# Patient Record
Sex: Male | Born: 1935 | Race: White | Hispanic: No | Marital: Married | Smoking: Never smoker
Health system: Southern US, Community
[De-identification: ages and names within clinical notes are randomized; demographics above are authoritative.]

## PROBLEM LIST (undated history)

## (undated) DIAGNOSIS — M436 Torticollis: Secondary | ICD-10-CM

## (undated) DIAGNOSIS — I10 Essential (primary) hypertension: Secondary | ICD-10-CM

## (undated) DIAGNOSIS — M1990 Unspecified osteoarthritis, unspecified site: Secondary | ICD-10-CM

## (undated) DIAGNOSIS — Z974 Presence of external hearing-aid: Secondary | ICD-10-CM

## (undated) HISTORY — PX: HERNIA REPAIR: SHX51

---

## 2010-09-22 DIAGNOSIS — I639 Cerebral infarction, unspecified: Secondary | ICD-10-CM

## 2010-09-22 HISTORY — DX: Cerebral infarction, unspecified: I63.9

## 2017-01-16 ENCOUNTER — Encounter (INDEPENDENT_AMBULATORY_CARE_PROVIDER_SITE_OTHER): Payer: Medicare Other

## 2017-01-16 ENCOUNTER — Ambulatory Visit (INDEPENDENT_AMBULATORY_CARE_PROVIDER_SITE_OTHER): Payer: Medicare Other | Admitting: Vascular Surgery

## 2017-04-26 ENCOUNTER — Emergency Department: Payer: Medicare Other

## 2017-04-26 ENCOUNTER — Emergency Department
Admission: EM | Admit: 2017-04-26 | Discharge: 2017-04-26 | Disposition: A | Discharge status: Home / Self Care | Payer: Medicare Other | Attending: Emergency Medicine | Admitting: Emergency Medicine

## 2017-04-26 DIAGNOSIS — I10 Essential (primary) hypertension: Secondary | ICD-10-CM | POA: Diagnosis not present

## 2017-04-26 DIAGNOSIS — Y929 Unspecified place or not applicable: Secondary | ICD-10-CM | POA: Insufficient documentation

## 2017-04-26 DIAGNOSIS — W010XXA Fall on same level from slipping, tripping and stumbling without subsequent striking against object, initial encounter: Secondary | ICD-10-CM | POA: Diagnosis not present

## 2017-04-26 DIAGNOSIS — W19XXXA Unspecified fall, initial encounter: Secondary | ICD-10-CM

## 2017-04-26 DIAGNOSIS — Z23 Encounter for immunization: Secondary | ICD-10-CM | POA: Insufficient documentation

## 2017-04-26 DIAGNOSIS — M25512 Pain in left shoulder: Secondary | ICD-10-CM | POA: Insufficient documentation

## 2017-04-26 DIAGNOSIS — S0003XA Contusion of scalp, initial encounter: Secondary | ICD-10-CM

## 2017-04-26 DIAGNOSIS — S0990XA Unspecified injury of head, initial encounter: Secondary | ICD-10-CM | POA: Diagnosis present

## 2017-04-26 DIAGNOSIS — M25511 Pain in right shoulder: Secondary | ICD-10-CM

## 2017-04-26 DIAGNOSIS — S8001XA Contusion of right knee, initial encounter: Secondary | ICD-10-CM

## 2017-04-26 DIAGNOSIS — Y999 Unspecified external cause status: Secondary | ICD-10-CM | POA: Insufficient documentation

## 2017-04-26 DIAGNOSIS — S8002XA Contusion of left knee, initial encounter: Secondary | ICD-10-CM | POA: Diagnosis not present

## 2017-04-26 DIAGNOSIS — Z7982 Long term (current) use of aspirin: Secondary | ICD-10-CM | POA: Diagnosis not present

## 2017-04-26 DIAGNOSIS — Y939 Activity, unspecified: Secondary | ICD-10-CM | POA: Insufficient documentation

## 2017-04-26 HISTORY — DX: Essential (primary) hypertension: I10

## 2017-04-26 MED ORDER — TETANUS-DIPHTH-ACELL PERTUSSIS 5-2.5-18.5 LF-MCG/0.5 IM SUSP
0.5000 mL | Freq: Once | INTRAMUSCULAR | Status: AC
  Administered 2017-04-26: 0.5 mL via INTRAMUSCULAR

## 2017-04-26 MED ORDER — PREDNISONE 10 MG PO TABS: 10.0000 mg | ORAL_TABLET | Freq: Every day | ORAL | 0 refills | Status: DC

## 2017-04-26 MED ORDER — TETANUS-DIPHTH-ACELL PERTUSSIS 5-2.5-18.5 LF-MCG/0.5 IM SUSP
INTRAMUSCULAR | Status: AC
  Filled 2017-04-26: qty 0.5
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
## 2017-04-26 NOTE — ED Triage Notes (Signed)
Patient c/o mechanical fall today. Pt reports right shoulder pain, left knee pain.  Patient c/o right knee pain to a lesser extent.  Pt c/o left shoulder pain to a lesser extent related to a fall 3 weeks ago.   Patient has abrasion to forehead, denies LOC, dizziness/lightheadedness, visual changes.

## 2017-04-26 NOTE — ED Notes (Signed)
Pt back from ct. Had xr's earlier. Pt alert, oriented. In nad. Family at bedside awaiting results.

## 2018-02-03 ENCOUNTER — Emergency Department: Payer: Medicare Other

## 2018-02-03 ENCOUNTER — Emergency Department
Admission: EM | Admit: 2018-02-03 | Discharge: 2018-02-03 | Disposition: A | Discharge status: Home / Self Care | Payer: Medicare Other | Attending: Emergency Medicine | Admitting: Emergency Medicine

## 2018-02-03 ENCOUNTER — Other Ambulatory Visit: Payer: Self-pay

## 2018-02-03 DIAGNOSIS — S51012A Laceration without foreign body of left elbow, initial encounter: Secondary | ICD-10-CM

## 2018-02-03 DIAGNOSIS — S51002A Unspecified open wound of left elbow, initial encounter: Secondary | ICD-10-CM | POA: Diagnosis not present

## 2018-02-03 DIAGNOSIS — W010XXA Fall on same level from slipping, tripping and stumbling without subsequent striking against object, initial encounter: Secondary | ICD-10-CM | POA: Insufficient documentation

## 2018-02-03 DIAGNOSIS — W19XXXA Unspecified fall, initial encounter: Secondary | ICD-10-CM

## 2018-02-03 DIAGNOSIS — S59902A Unspecified injury of left elbow, initial encounter: Secondary | ICD-10-CM | POA: Diagnosis present

## 2018-02-03 DIAGNOSIS — Y999 Unspecified external cause status: Secondary | ICD-10-CM | POA: Diagnosis not present

## 2018-02-03 DIAGNOSIS — Y939 Activity, unspecified: Secondary | ICD-10-CM | POA: Insufficient documentation

## 2018-02-03 DIAGNOSIS — Y9289 Other specified places as the place of occurrence of the external cause: Secondary | ICD-10-CM | POA: Diagnosis not present

## 2018-02-03 DIAGNOSIS — I10 Essential (primary) hypertension: Secondary | ICD-10-CM | POA: Diagnosis not present
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
## 2018-02-03 NOTE — ED Provider Notes (Signed)
[facility] Emergency Department Provider Note  ____________________________________________  Time seen: Approximately 3:50 PM  I have reviewed the triage vital signs and the nursing notes.   HISTORY  Chief Complaint Fall and Abrasion    HPI Jose Porter is a 84 y.o. male presents to the emergency department with a left elbow skin tear.  Patient reports that he was stepping into [place] when he lost his footing and fell on left elbow.  Patient reports that he did not hit his head or lose consciousness.  He has been ambulating without difficulty.  He denies new onset blurry vision, nausea, vomiting or disorientation.  Patient is accompanied by his wife who denies any changes in behavior.  Patient and his wife live independently.  No weakness, radiculopathy or changes in sensation in the upper or lower extremities.  Past Medical History:  Diagnosis Date  . Hypertension     There are no active problems to display for this patient.   Past Surgical History:  Procedure Laterality Date  . HERNIA REPAIR      Prior to Admission medications   Medication Sig Start Date End Date Taking? Authorizing Provider  predniSONE (DELTASONE) 10 MG tablet Take 1 tablet (10 mg total) by mouth daily. 04/26/17   Cuthriell, Delorise Royals, PA-C    Allergies Patient has no known allergies.  No family history on file.  Social History Social History   Tobacco Use  . Smoking status: Never Smoker  . Smokeless tobacco: Never Used  Substance Use Topics  . Alcohol use: No  . Drug use: No     Review of Systems  Constitutional: No fever/chills Eyes: No visual changes. No discharge ENT: No upper respiratory complaints. Cardiovascular: no chest pain. Respiratory: no cough. No SOB. Gastrointestinal: No abdominal pain.  No nausea, no vomiting.  No diarrhea.  No constipation. Musculoskeletal: Patient has left elbow discomfort.  Skin: Patient has skin tear.  Neurological:  Negative for headaches, focal weakness or numbness.  ____________________________________________   PHYSICAL EXAM:  VITAL SIGNS: ED Triage Vitals [02/03/18 1514]  Enc Vitals Group     BP (!) 143/90     Pulse Rate 81     Resp 16     Temp 97.8 F (36.6 C)     Temp Source Oral     SpO2 95 %     Weight 184 lb (83.5 kg)     Height  (1.753 m)     Head Circumference      Peak Flow      Pain Score 2     Pain Loc      Pain Edu?      Excl. in [place]?      Constitutional: Alert and oriented. Well appearing and in no acute distress. Eyes: Conjunctivae are normal. PERRL. EOMI. Head: Atraumatic. ENT:      Ears: TMs are pearly.      Nose: No congestion/rhinnorhea.      Mouth/Throat: Mucous membranes are moist.  Neck: No stridor. No cervical spine tenderness to palpation. Cardiovascular: Normal rate, regular rhythm. Normal S1 and S2.  Good peripheral circulation. Respiratory: Normal respiratory effort without tachypnea or retractions. Lungs CTAB. Good air entry to the bases with no decreased or absent breath sounds. Gastrointestinal: Bowel sounds 4 quadrants. Soft and nontender to palpation. No guarding or rigidity. No palpable masses. No distention. No CVA tenderness. Musculoskeletal: Full range of motion to all extremities. No gross deformities appreciated. Neurologic:  Normal speech and language. No  gross focal neurologic deficits are appreciated.  Skin:  Patient has a 2 cm skin tear.  Skin tear is avulsion type in nature.  Avulsion fragment is severely retracted.  No adipose tissue or muscle exposure.   Psychiatric: Mood and affect are normal. Speech and behavior are normal. Patient exhibits appropriate insight and judgement.   ____________________________________________   LABS (all labs ordered are listed, but only abnormal results are displayed)  Labs Reviewed - No data to  display ____________________________________________  EKG   ____________________________________________  RADIOLOGY Geraldo Pitter, personally viewed and evaluated these images (plain radiographs) as part of my medical decision making, as well as reviewing the written report by the radiologist.    Dg Elbow Complete Left  Result Date: 02/03/2018 CLINICAL DATA:  Acute LEFT elbow pain following fall. Initial encounter. EXAM: LEFT ELBOW - COMPLETE 3+ VIEW COMPARISON:  None. FINDINGS: There is no evidence of acute fracture, subluxation or dislocation. No joint effusion noted. Mild chronic bony changes along the LATERAL distal humerus noted. IMPRESSION: No evidence of acute abnormality. Electronically Signed   By: Harmon Pier M.D.   On: 02/03/2018 16:06    ____________________________________________    PROCEDURES  Procedure(s) performed:    Procedures  LACERATION REPAIR Performed by: Orvil Feil Authorized by: Orvil Feil Consent: Verbal consent obtained. Risks and benefits: risks, benefits and alternatives were discussed Consent given by: patient Patient identity confirmed: provided demographic data Prepped and Draped in normal sterile fashion Wound explored  Laceration Location: Left elbow  Laceration Length: 2 cm  No Foreign Bodies seen or palpated  Anesthesia: None   Irrigation method: syringe Amount of cleaning: standard  Skin closure: Dermabond   Patient tolerance: Patient tolerated the procedure well with no immediate complications.   Medications - No data to display   ____________________________________________   INITIAL IMPRESSION / ASSESSMENT AND PLAN / ED COURSE  Pertinent labs & imaging results that were available during my care of the patient were reviewed by me and considered in my medical decision making (see chart for details).  Review of the [place] CSRS was performed in accordance of the [facility] prior to dispensing any controlled  drugs.     Assessment and plan: Skin tear:  Patient presents to the emergency department with a 2 cm skin tear along the dorsal aspect of the left elbow repaired with Dermabond in the emergency department without complication.  Patient education regarding basic wound care was provided.  Patient was advised to follow-up with primary care as needed.  All patient questions were answered. ____________________________________________  FINAL CLINICAL IMPRESSION(S) / ED DIAGNOSES  Final diagnoses:  Fall, initial encounter  Skin tear of left elbow without complication, initial encounter      NEW MEDICATIONS STARTED DURING THIS VISIT:  ED Discharge Orders    None          This chart was dictated using voice recognition software/Dragon. Despite best efforts to proofread, errors can occur which can change the meaning. Any change was purely unintentional.    Orvil Feil, PA-C 02/03/18 1623    Don Perking, [place], MD 02/06/18 [phone]

## 2019-09-14 ENCOUNTER — Emergency Department
Admission: EM | Admit: 2019-09-14 | Discharge: 2019-09-14 | Disposition: A | Discharge status: Home / Self Care | Payer: Medicare Other | Attending: Emergency Medicine | Admitting: Emergency Medicine

## 2019-09-14 ENCOUNTER — Other Ambulatory Visit: Payer: Self-pay

## 2019-09-14 ENCOUNTER — Encounter: Payer: Self-pay | Admitting: Emergency Medicine

## 2019-09-14 DIAGNOSIS — L089 Local infection of the skin and subcutaneous tissue, unspecified: Secondary | ICD-10-CM | POA: Insufficient documentation

## 2019-09-14 DIAGNOSIS — S61512D Laceration without foreign body of left wrist, subsequent encounter: Secondary | ICD-10-CM | POA: Insufficient documentation

## 2019-09-14 DIAGNOSIS — T148XXA Other injury of unspecified body region, initial encounter: Secondary | ICD-10-CM

## 2019-09-14 DIAGNOSIS — I10 Essential (primary) hypertension: Secondary | ICD-10-CM | POA: Insufficient documentation

## 2019-09-14 DIAGNOSIS — Z5189 Encounter for other specified aftercare: Secondary | ICD-10-CM | POA: Insufficient documentation

## 2019-09-14 DIAGNOSIS — S61011D Laceration without foreign body of right thumb without damage to nail, subsequent encounter: Secondary | ICD-10-CM | POA: Diagnosis not present

## 2019-09-14 DIAGNOSIS — W19XXXD Unspecified fall, subsequent encounter: Secondary | ICD-10-CM | POA: Diagnosis not present

## 2019-09-14 MED ORDER — CEPHALEXIN 500 MG PO CAPS
500.0000 mg | ORAL_CAPSULE | Freq: Once | ORAL | Status: AC
Start: 2019-09-14 — End: 2019-09-14
  Administered 2019-09-14: 500 mg via ORAL
  Filled 2019-09-14: qty 1

## 2019-09-14 MED ORDER — CEPHALEXIN 500 MG PO CAPS: 500.0000 mg | ORAL_CAPSULE | Freq: Three times a day (TID) | ORAL | 0 refills | Status: AC

## 2019-09-14 NOTE — ED Triage Notes (Signed)
[facility] [facility] last week and went to urgent care and had left wrist wound taken care of--also stitches in right thumb.  But left wrist is now red and puffy around the wound.

## 2019-09-14 NOTE — ED Notes (Signed)
Pt ambulatory to room with personal cane. Pt reports hitting L wrist when he fell about a week ago. Pt has stitches to L wrist/hand and to R hand. Injury/stitches at R hand C/D/I. Injury/stitches a L wrist hot/red/swollen and wrist stiff to pt. Pt denies fever/chills/sweats. No bleeding noted. Radial pulses 2+ bilaterally.

## 2019-09-14 NOTE — ED Notes (Signed)
Pt's family given update with verbal okay from pt.

## 2019-09-14 NOTE — Discharge Instructions (Signed)
Keep the wound clean, dry, and covered. Wash daily with mild soap & water. Take the antibiotic as directed. Follow-up with the Urgent Care or Dr. Kym Groom for suture removal in 5-7 days.

## 2019-09-14 NOTE — ED Provider Notes (Signed)
[facility] Emergency Department Provider Note ____________________________________________  Time seen: 1656  I have reviewed the triage vital signs and the nursing notes.  HISTORY  Chief Complaint  Wound Check  HPI Jose Porter is a 85 y.o. male presents himself to the ED for wound check.  Patient describes a mechanical fall almost a week earlier where he sustained a laceration to his right thumb and left dorsal wrist.  He was evaluated and treated at a local urgent care, with suture repair see his wound.  X-ray did not reveal any underlying fractures.   His tetanus was updated at that visit.  Patient presents now after concern for some local erythema to the 2 wounds.  He denies any interim fevers, chills, sweats.  He also denies any wound dehiscence or purulent drainage.  He admits to keeping the wounds uncovered, and has not done any specific dedicated wound care to the areas.  Past Medical History:  Diagnosis Date  . Hypertension     There are no problems to display for this patient.   Past Surgical History:  Procedure Laterality Date  . HERNIA REPAIR      Prior to Admission medications   Medication Sig Start Date End Date Taking? Authorizing Provider  cephALEXin (KEFLEX) 500 MG capsule Take 1 capsule (500 mg total) by mouth 3 (three) times daily for 7 days. 09/14/19 09/21/19  Mallori Araque, Dannielle Karvonen, PA-C    Allergies Patient has no known allergies.  No family history on file.  Social History Social History   Tobacco Use  . Smoking status: Never Smoker  . Smokeless tobacco: Never Used  Substance Use Topics  . Alcohol use: No  . Drug use: No    Review of Systems  Constitutional: Negative for fever. Cardiovascular: Negative for chest pain. Respiratory: Negative for shortness of breath. Gastrointestinal: Negative for abdominal pain, vomiting and diarrhea. Genitourinary: Negative for dysuria. Musculoskeletal: Negative for back  pain. Skin: Negative for rash.  Wound infection as above. Neurological: Negative for headaches, focal weakness or numbness. ____________________________________________  PHYSICAL EXAM:  VITAL SIGNS: ED Triage Vitals  Enc Vitals Group     BP 09/14/19 1559 (!) 156/71     Pulse Rate 09/14/19 1559 77     Resp 09/14/19 1559 14     Temp 09/14/19 1559 97.6 F (36.4 C)     Temp Source 09/14/19 1559 Oral     SpO2 09/14/19 1559 98 %     Weight 09/14/19 1556 175 lb (79.4 kg)     Height 09/14/19 1556 5\' 9"  (1.753 m)     Head Circumference --      Peak Flow --      Pain Score 09/14/19 1555 3     Pain Loc --      Pain Edu? --      Excl. in [place]? --     Constitutional: Alert and oriented. Well appearing and in no distress. Head: Normocephalic and atraumatic. Eyes: Conjunctivae are normal. Normal extraocular movements Cardiovascular: Normal rate, regular rhythm. Normal distal pulses. Respiratory: Normal respiratory effort.  Musculoskeletal: normal composite fists bilaterally. Nontender with normal range of motion in all extremities.  Neurologic:  Normal gait without ataxia. Normal speech and language. No gross focal neurologic deficits are appreciated. Skin:  Skin is warm, dry and intact. No rash noted. Sutured wound to the left radial wrist with central scab and local erythema. No induration, dehisence, or purulent drainage noted. Sutured wound to the base of the right  thumb with some mild local (reactive) erythema.  Psychiatric: Mood and affect are normal. Patient exhibits appropriate insight and judgment. ____________________________________________  PROCEDURES  Keflex 500 mg PO Wound care Dressing applied Procedures ____________________________________________  INITIAL IMPRESSION / ASSESSMENT AND PLAN / ED COURSE  Patient with ED evaluation for concern of a possible wound infection.  Patient had a large abrasion and laceration to the radial aspect of the left wrist as well as an  abrasion/laceration to the right thumb repaired about a week earlier.  Patient has not kept the wound covered is not performed any local wound care.  The left wrist reveals some local erythema without wound dehiscence.  Patient's wound will be cleansed and dressed at this time.  Be treated empirically with Keflex and advised to follow with primary provider in 1 week for suture removal.  Return precautions have been reviewed.  [name] was evaluated in Emergency Department on 09/14/2019 for the symptoms described in the history of present illness. He was evaluated in the context of the global COVID-19 pandemic, which necessitated consideration that the patient might be at risk for infection with the SARS-CoV-2 virus that causes COVID-19. Institutional protocols and algorithms that pertain to the evaluation of patients at risk for COVID-19 are in a state of rapid change based on information released by regulatory bodies including the CDC and federal and state organizations. These policies and algorithms were followed during the patient's care in the ED. ____________________________________________  FINAL CLINICAL IMPRESSION(S) / ED DIAGNOSES  Final diagnoses:  Visit for wound check  Wound infection      Lissa Hoard, PA-C 09/14/19 1802    Chesley Noon, MD 09/16/19 [phone]

## 2019-09-14 NOTE — ED Notes (Signed)
Wound cleansed and dressing applied per PA's order.

## 2021-10-09 ENCOUNTER — Emergency Department: Payer: Medicare Other

## 2021-10-09 ENCOUNTER — Encounter: Payer: Self-pay | Admitting: Emergency Medicine

## 2021-10-09 ENCOUNTER — Other Ambulatory Visit: Payer: Self-pay

## 2021-10-09 ENCOUNTER — Emergency Department
Admission: EM | Admit: 2021-10-09 | Discharge: 2021-10-09 | Disposition: A | Discharge status: Home / Self Care | Payer: Medicare Other | Attending: Emergency Medicine | Admitting: Emergency Medicine

## 2021-10-09 DIAGNOSIS — Y9289 Other specified places as the place of occurrence of the external cause: Secondary | ICD-10-CM | POA: Insufficient documentation

## 2021-10-09 DIAGNOSIS — S0001XA Abrasion of scalp, initial encounter: Secondary | ICD-10-CM | POA: Insufficient documentation

## 2021-10-09 DIAGNOSIS — I10 Essential (primary) hypertension: Secondary | ICD-10-CM | POA: Diagnosis not present

## 2021-10-09 DIAGNOSIS — S62642A Nondisplaced fracture of proximal phalanx of right middle finger, initial encounter for closed fracture: Secondary | ICD-10-CM | POA: Diagnosis not present

## 2021-10-09 DIAGNOSIS — R519 Headache, unspecified: Secondary | ICD-10-CM | POA: Diagnosis not present

## 2021-10-09 DIAGNOSIS — S6991XA Unspecified injury of right wrist, hand and finger(s), initial encounter: Secondary | ICD-10-CM | POA: Diagnosis present

## 2021-10-09 DIAGNOSIS — S62643A Nondisplaced fracture of proximal phalanx of left middle finger, initial encounter for closed fracture: Secondary | ICD-10-CM

## 2021-10-09 DIAGNOSIS — W01198A Fall on same level from slipping, tripping and stumbling with subsequent striking against other object, initial encounter: Secondary | ICD-10-CM | POA: Diagnosis not present

## 2021-10-09 DIAGNOSIS — Y9389 Activity, other specified: Secondary | ICD-10-CM | POA: Diagnosis not present

## 2021-10-09 DIAGNOSIS — M542 Cervicalgia: Secondary | ICD-10-CM | POA: Diagnosis not present

## 2021-10-09 DIAGNOSIS — W19XXXA Unspecified fall, initial encounter: Secondary | ICD-10-CM

## 2021-10-09 NOTE — ED Triage Notes (Signed)
Pt to ED via [facility], with c/o fall at [place] from trying to get a run away cart, tripped and fell and hit his head. No LOC, He is having some neck pain and right hand pain. He does have a small abrasion on the top of his head, he does take a baby ASA every day.

## 2021-10-09 NOTE — ED Provider Notes (Signed)
[facility] Provider Note    Event Date/Time   First MD Initiated Contact with Patient 10/09/21 2006     (approximate)   History   Chief Complaint Fall   HPI  Jose Porter is a 88 y.o. male, history of hypertension, presents emergency department for evaluation of injury sustained from fall.  Patient states that he was trying to chase a grocery cart that was rolling away in the [employer] parking lot when he tripped and fell and hit his head.  Patient states that he also try to catch himself with his right hand.  He is currently endorsing mild headache, neck pain, and mild right hand pain.  Denies blood thinners, but does take baby aspirin every day.  Patient states that he has poor recollection of the event, but denies loss of consciousness.  Denies any symptoms preceding the event or immediate symptoms after the fall.  History Limitations: No limitations.      Physical Exam  Triage Vital Signs: ED Triage Vitals  Enc Vitals Group     BP 10/09/21 1920 (!) 160/92     Pulse Rate 10/09/21 1920 79     Resp 10/09/21 1920 18     Temp 10/09/21 1920 (!) 97.5 F (36.4 C)     Temp Source 10/09/21 1920 Oral     SpO2 10/09/21 1920 94 %     Weight 10/09/21 1921 190 lb (86.2 kg)     Height 10/09/21 1921 5\' 10"  (1.778 m)     Head Circumference --      Peak Flow --      Pain Score 10/09/21 1920 2     Pain Loc --      Pain Edu? --      Excl. in GC? --     Most recent vital signs: Vitals:   10/09/21 1920 10/09/21 2254  BP: (!) 160/92 (!) 159/87  Pulse: 79 82  Resp: 18 16  Temp: (!) 97.5 F (36.4 C)   SpO2: 94% 96%     Physical Exam Constitutional:      General: He is not in acute distress.    Appearance: Normal appearance. He is not ill-appearing.  HENT:     Head: Normocephalic.     Comments: 2 cm abrasion appreciated on the top of the scalp.  No active bleeding or discharge. Eyes:     Extraocular Movements: Extraocular movements intact.      Pupils: Pupils are equal, round, and reactive to light.  Pulmonary:     Effort: Pulmonary effort is normal.  Abdominal:     General: Abdomen is flat.     Palpations: Abdomen is soft.     Tenderness: There is no abdominal tenderness.  Musculoskeletal:     Cervical back: Normal range of motion.     Comments: No gross deformities to the patient's right hand, though notably arthritic.  Range of motion intact.  5/5 strength.  Pulse and sensation intact.  Patient does not have any bony tenderness and phalanges, metacarpal joints, or carpal bones.  Skin:    General: Skin is warm and dry.     Capillary Refill: Capillary refill takes less than 2 seconds.  Neurological:     Mental Status: He is alert and oriented to person, place, and time. Mental status is at baseline.     Cranial Nerves: No cranial nerve deficit.     Sensory: No sensory deficit.     Motor: No weakness.  Psychiatric:  Mood and Affect: Mood normal.        Behavior: Behavior normal.        Thought Content: Thought content normal.        Judgment: Judgment normal.      ED Results / Procedures / Treatments  Labs (all labs ordered are listed, but only abnormal results are displayed) Labs Reviewed - No data to display   EKG Not applicable.    RADIOLOGY I personally viewed and evaluated these images as part of my medical decision making, as well as reviewing the written report by the radiologist.  ED Provider Interpretation: No acute intracranial abnormalities.  Small intra-articular fracture at the base of the third phalange.  Volar subluxation of the fifth metacarpophalangeal joint.  I agree with the interpretations of the radiologist  CT Head Wo Contrast  Result Date: 10/09/2021 CLINICAL DATA:  Initial evaluation for acute trauma. EXAM: CT HEAD WITHOUT CONTRAST CT CERVICAL SPINE WITHOUT CONTRAST TECHNIQUE: Multidetector CT imaging of the head and cervical spine was performed following the standard protocol  without intravenous contrast. Multiplanar CT image reconstructions of the cervical spine were also generated. RADIATION DOSE REDUCTION: This exam was performed according to the departmental dose-optimization program which includes automated exposure control, adjustment of the mA and/or kV according to patient size and/or use of iterative reconstruction technique. COMPARISON:  None. FINDINGS: CT HEAD FINDINGS Brain: Age-related cerebral atrophy with chronic small vessel ischemic disease. Remote lacunar infarcts about the thalami. No acute intracranial hemorrhage. No acute large vessel territory infarct. No mass lesion or midline shift. No hydrocephalus or extra-axial fluid collection. Vascular: No hyperdense vessel. Calcified atherosclerosis present at skull base. Skull: Small soft tissue contusion/laceration at the scalp vertex. Calvarium intact. Lipoma noted at the left temporal region. Sinuses/Orbits: Visualized globes and orbital soft tissues within normal limits. Chronic right frontoethmoidal sinusitis. Mastoid air cells are clear. Other: None. CT CERVICAL SPINE FINDINGS Alignment: Straightening with reversal of the normal cervical lordosis. Trace anterolisthesis of C2 on C3, likely chronic and facet mediated. Skull base and vertebrae: Visualized skull base intact. Normal C1-2 articulations are preserved. Irregular lucencies with sclerotic margins extending through the right C2-3 facet noted, favored to be chronic and degenerative. Vertebral body height maintained. No acute fracture. No discrete or worrisome osseous lesions. Soft tissues and spinal canal: Paraspinous soft tissues demonstrate no acute finding. No abnormal prevertebral edema. Vascular calcifications noted about the carotid bifurcations. Disc levels: Moderate degenerative spondylosis at C3-4 through C6-7. Upper chest: Visualized upper chest demonstrates no acute finding. Other: None. IMPRESSION: CT BRAIN: 1. No acute intracranial abnormality. 2.  Small soft tissue contusion/laceration at the scalp vertex. No calvarial fracture. 3. Age-related cerebral atrophy with chronic small vessel ischemic disease. 4. Chronic right frontoethmoidal sinusitis. CT CERVICAL SPINE: 1. No acute traumatic injury within the cervical spine. 2. Moderate degenerative spondylosis at C3-4 through C6-7. Electronically Signed   By: Rise MuBenjamin  McClintock M.D.   On: 10/09/2021 21:47   CT Cervical Spine Wo Contrast  Result Date: 10/09/2021 CLINICAL DATA:  Initial evaluation for acute trauma. EXAM: CT HEAD WITHOUT CONTRAST CT CERVICAL SPINE WITHOUT CONTRAST TECHNIQUE: Multidetector CT imaging of the head and cervical spine was performed following the standard protocol without intravenous contrast. Multiplanar CT image reconstructions of the cervical spine were also generated. RADIATION DOSE REDUCTION: This exam was performed according to the departmental dose-optimization program which includes automated exposure control, adjustment of the mA and/or kV according to patient size and/or use of iterative reconstruction technique. COMPARISON:  None.  FINDINGS: CT HEAD FINDINGS Brain: Age-related cerebral atrophy with chronic small vessel ischemic disease. Remote lacunar infarcts about the thalami. No acute intracranial hemorrhage. No acute large vessel territory infarct. No mass lesion or midline shift. No hydrocephalus or extra-axial fluid collection. Vascular: No hyperdense vessel. Calcified atherosclerosis present at skull base. Skull: Small soft tissue contusion/laceration at the scalp vertex. Calvarium intact. Lipoma noted at the left temporal region. Sinuses/Orbits: Visualized globes and orbital soft tissues within normal limits. Chronic right frontoethmoidal sinusitis. Mastoid air cells are clear. Other: None. CT CERVICAL SPINE FINDINGS Alignment: Straightening with reversal of the normal cervical lordosis. Trace anterolisthesis of C2 on C3, likely chronic and facet mediated. Skull  base and vertebrae: Visualized skull base intact. Normal C1-2 articulations are preserved. Irregular lucencies with sclerotic margins extending through the right C2-3 facet noted, favored to be chronic and degenerative. Vertebral body height maintained. No acute fracture. No discrete or worrisome osseous lesions. Soft tissues and spinal canal: Paraspinous soft tissues demonstrate no acute finding. No abnormal prevertebral edema. Vascular calcifications noted about the carotid bifurcations. Disc levels: Moderate degenerative spondylosis at C3-4 through C6-7. Upper chest: Visualized upper chest demonstrates no acute finding. Other: None. IMPRESSION: CT BRAIN: 1. No acute intracranial abnormality. 2. Small soft tissue contusion/laceration at the scalp vertex. No calvarial fracture. 3. Age-related cerebral atrophy with chronic small vessel ischemic disease. 4. Chronic right frontoethmoidal sinusitis. CT CERVICAL SPINE: 1. No acute traumatic injury within the cervical spine. 2. Moderate degenerative spondylosis at C3-4 through C6-7. Electronically Signed   By: Rise Mu M.D.   On: 10/09/2021 21:47   DG Hand Complete Right  Result Date: 10/09/2021 CLINICAL DATA:  Status post reduction EXAM: RIGHT HAND - COMPLETE 3+ VIEW COMPARISON:  Film from earlier in the same day. FINDINGS: Fracture is again noted at the base of the third proximal phalanx stable in appearance from the prior exam. There remains interphalangeal degenerative changes as well as soft tissue swelling over the fifth MCP joint with mild volar subluxation of the proximal phalanx with respect to the metacarpal head. No other focal abnormality is seen. IMPRESSION: Stable fracture at the base of the third proximal phalanx. The remainder of the exam is unchanged. Electronically Signed   By: Alcide Clever M.D.   On: 10/09/2021 22:11   DG Hand Complete Right  Result Date: 10/09/2021 CLINICAL DATA:  Larey Seat, abrasions EXAM: RIGHT HAND - COMPLETE 3+ VIEW  COMPARISON:  None. FINDINGS: Frontal, oblique, and lateral views of the right hand are obtained. There is a small intra-articular fracture radial aspect base of the third proximal phalanx. Prominent volar subluxation of the fifth proximal phalanx relative to the fifth metacarpal head. Marked soft tissue swelling overlying the metacarpophalangeal joints. Diffuse osteoarthritis, greatest at the third distal interphalangeal joint and first interphalangeal joint. Likely subchondral cyst at the base of the second proximal phalanx. IMPRESSION: 1. Small intra-articular fracture at the base of the third proximal phalanx. 2. Volar subluxation of the fifth metacarpophalangeal joint. 3. Prominent soft tissue swelling overlying the metacarpophalangeal joints. 4. Multifocal osteoarthritis. Electronically Signed   By: Sharlet Salina M.D.   On: 10/09/2021 19:59    PROCEDURES:  Critical Care performed: None.  Procedures    MEDICATIONS ORDERED IN ED: Medications - No data to display   IMPRESSION / MDM / ASSESSMENT AND PLAN / ED COURSE  I reviewed the triage vital signs and the nursing notes.  Twana FirstKenneth Chiao is a 88 y.o. male, history of hypertension, presents emergency department for evaluation of injury sustained from fall.  Patient states that he was trying to chase a grocery cart that was rolling away in the [employer] parking lot when he tripped and fell and hit his head.  Patient states that he also try to catch himself with his right hand.  He is currently endorsing mild headache, neck pain, and mild right hand pain.   Differential diagnosis includes, but is not limited to, phalangeal dislocation/fracture, metacarpal dislocation/fracture, intracranial injury, concussion.  Patient appears well.  He is sitting upright comfortably in bed.  No apparent distress.  He is hypertensive at 160/92, but otherwise normal vitals.  Physical exam notable for abrasion on the top of the  scalp with no active bleeding.  Hands are notably arthritic, but no gross deformities present.  X-ray shows  Small intra-articular fracture at the base of the third phalange.  Volar subluxation of the fifth metacarpophalangeal joint.  Patient is not currently complaining of any pain with palpation or manipulation of his hand.  I went ahead and attempted reduction of the subluxated joint.  Patient tolerated the procedure well.  Will order postreduction x-ray.  Head CT negative for acute intracranial abnormalities.  Cervical spine CT unremarkable as well for acute pathology.  Post reduction x-ray continues to show mild subluxation of the fifth metacarpal phalangeal joint.  Attempted a second reduction, which was successful.  The patient's fracture on the third phalangeal was treated with buddy splinting to adjacent finger.  No further work-up or treatment indicated at this time in the emergency department.  We will plan to discharge this patient home. Patient was provided with anticipatory guidance, return precautions, and educational material. Encouraged the patient to return to the emergency department at any time if they begin to experience any new or worsening symptoms.       FINAL CLINICAL IMPRESSION(S) / ED DIAGNOSES   Final diagnoses:  Fall, initial encounter  Closed nondisplaced fracture of proximal phalanx of left middle finger, initial encounter     Rx / DC Orders   ED Discharge Orders     None        Note:  This document was prepared using Dragon voice recognition software and may include unintentional dictation errors.   Varney DailySimpson, Luma Clopper Lee, [place] 10/09/21 2359    Shaune PollackIsaacs, [place], MD 10/10/21 1943

## 2021-10-09 NOTE — Discharge Instructions (Signed)
-  Follow up with the orthopedist listed above -Please return to the ED at any time if you begin to experience any new or worsening symptoms -Continue to maintain splinting of the finger for at least 3 weeks, or until orthopedic follow up.

## 2022-03-24 ENCOUNTER — Inpatient Hospital Stay
Admission: EM | Admit: 2022-03-24 | Discharge: 2022-03-27 | DRG: 683 | Disposition: A | Discharge status: Home Health | Payer: Medicare Other | Source: Ambulatory Visit | Attending: Internal Medicine | Admitting: Internal Medicine

## 2022-03-24 ENCOUNTER — Other Ambulatory Visit: Payer: Self-pay

## 2022-03-24 ENCOUNTER — Emergency Department: Payer: Medicare Other

## 2022-03-24 DIAGNOSIS — N1832 Chronic kidney disease, stage 3b: Secondary | ICD-10-CM | POA: Diagnosis present

## 2022-03-24 DIAGNOSIS — E871 Hypo-osmolality and hyponatremia: Secondary | ICD-10-CM | POA: Diagnosis not present

## 2022-03-24 DIAGNOSIS — Z79899 Other long term (current) drug therapy: Secondary | ICD-10-CM

## 2022-03-24 DIAGNOSIS — Y92013 Bedroom of single-family (private) house as the place of occurrence of the external cause: Secondary | ICD-10-CM

## 2022-03-24 DIAGNOSIS — S060XAA Concussion with loss of consciousness status unknown, initial encounter: Secondary | ICD-10-CM | POA: Diagnosis present

## 2022-03-24 DIAGNOSIS — N1831 Chronic kidney disease, stage 3a: Secondary | ICD-10-CM | POA: Diagnosis not present

## 2022-03-24 DIAGNOSIS — N170 Acute kidney failure with tubular necrosis: Principal | ICD-10-CM

## 2022-03-24 DIAGNOSIS — W19XXXA Unspecified fall, initial encounter: Secondary | ICD-10-CM | POA: Diagnosis not present

## 2022-03-24 DIAGNOSIS — I129 Hypertensive chronic kidney disease with stage 1 through stage 4 chronic kidney disease, or unspecified chronic kidney disease: Secondary | ICD-10-CM | POA: Diagnosis present

## 2022-03-24 DIAGNOSIS — I10 Essential (primary) hypertension: Secondary | ICD-10-CM

## 2022-03-24 DIAGNOSIS — N179 Acute kidney failure, unspecified: Secondary | ICD-10-CM | POA: Diagnosis present

## 2022-03-24 DIAGNOSIS — E785 Hyperlipidemia, unspecified: Secondary | ICD-10-CM | POA: Diagnosis present

## 2022-03-24 DIAGNOSIS — N419 Inflammatory disease of prostate, unspecified: Secondary | ICD-10-CM | POA: Diagnosis present

## 2022-03-24 DIAGNOSIS — S060X0A Concussion without loss of consciousness, initial encounter: Secondary | ICD-10-CM | POA: Diagnosis not present

## 2022-03-24 DIAGNOSIS — B029 Zoster without complications: Secondary | ICD-10-CM | POA: Diagnosis present

## 2022-03-24 DIAGNOSIS — W06XXXA Fall from bed, initial encounter: Secondary | ICD-10-CM | POA: Diagnosis present

## 2022-03-24 DIAGNOSIS — E860 Dehydration: Secondary | ICD-10-CM | POA: Diagnosis present

## 2022-03-24 DIAGNOSIS — N400 Enlarged prostate without lower urinary tract symptoms: Secondary | ICD-10-CM | POA: Diagnosis present

## 2022-03-24 DIAGNOSIS — T368X5A Adverse effect of other systemic antibiotics, initial encounter: Secondary | ICD-10-CM | POA: Diagnosis present

## 2022-03-24 DIAGNOSIS — Y92009 Unspecified place in unspecified non-institutional (private) residence as the place of occurrence of the external cause: Secondary | ICD-10-CM | POA: Diagnosis not present

## 2022-03-24 LAB — URINALYSIS, ROUTINE W REFLEX MICROSCOPIC
Bilirubin Urine: NEGATIVE
Glucose, UA: NEGATIVE mg/dL
Hgb urine dipstick: NEGATIVE
Ketones, ur: NEGATIVE mg/dL
Leukocytes,Ua: NEGATIVE
Nitrite: NEGATIVE
Protein, ur: NEGATIVE mg/dL
Specific Gravity, Urine: 1.01 (ref 1.005–1.030)
pH: 5 (ref 5.0–8.0)

## 2022-03-24 LAB — BASIC METABOLIC PANEL
Anion gap: 9 (ref 5–15)
BUN: 39 mg/dL — ABNORMAL HIGH (ref 8–23)
CO2: 19 mmol/L — ABNORMAL LOW (ref 22–32)
Calcium: 8.9 mg/dL (ref 8.9–10.3)
Chloride: 103 mmol/L (ref 98–111)
Creatinine, Ser: 2.7 mg/dL — ABNORMAL HIGH (ref 0.61–1.24)
GFR, Estimated: 22 mL/min — ABNORMAL LOW (ref >60)
Glucose, Bld: 124 mg/dL — ABNORMAL HIGH (ref 70–99)
Potassium: 4.4 mmol/L (ref 3.5–5.1)
Sodium: 131 mmol/L — ABNORMAL LOW (ref 135–145)

## 2022-03-24 LAB — CBC
HCT: 35.8 % — ABNORMAL LOW (ref 39.0–52.0)
Hemoglobin: 12.1 g/dL — ABNORMAL LOW (ref 13.0–17.0)
MCH: 30.2 pg (ref 26.0–34.0)
MCHC: 33.8 g/dL (ref 30.0–36.0)
MCV: 89.3 fL (ref 80.0–100.0)
Platelets: 181 10*3/uL (ref 150–400)
RBC: 4.01 MIL/uL — ABNORMAL LOW (ref 4.22–5.81)
RDW: 14.3 % (ref 11.5–15.5)
WBC: 4.5 10*3/uL (ref 4.0–10.5)
nRBC: 0 % (ref 0.0–0.2)

## 2022-03-24 LAB — MAGNESIUM: Magnesium: 2.2 mg/dL (ref 1.7–2.4)

## 2022-03-24 LAB — TSH: TSH: 3.556 u[IU]/mL (ref 0.350–4.500)

## 2022-03-24 MED ORDER — SIMVASTATIN 20 MG PO TABS
20.0000 mg | ORAL_TABLET | Freq: Every day | ORAL | Status: DC
  Administered 2022-03-24 – 2022-03-26 (×3): 20 mg via ORAL
  Filled 2022-03-24 (×3): qty 1

## 2022-03-24 MED ORDER — FINASTERIDE 5 MG PO TABS
5.0000 mg | ORAL_TABLET | Freq: Every day | ORAL | Status: DC
  Administered 2022-03-24 – 2022-03-27 (×4): 5 mg via ORAL
  Filled 2022-03-24 (×5): qty 1

## 2022-03-24 MED ORDER — ONDANSETRON HCL 4 MG/2ML IJ SOLN: 4.0000 mg | Freq: Four times a day (QID) | INTRAMUSCULAR | Status: DC | PRN

## 2022-03-24 MED ORDER — VITAMIN B-12 1000 MCG PO TABS
1000.0000 ug | ORAL_TABLET | Freq: Every day | ORAL | Status: DC
  Administered 2022-03-24 – 2022-03-27 (×4): 1000 ug via ORAL
  Filled 2022-03-24 (×5): qty 1

## 2022-03-24 MED ORDER — OYSTER SHELL CALCIUM/D3 500-5 MG-MCG PO TABS
1.0000 | ORAL_TABLET | Freq: Two times a day (BID) | ORAL | Status: DC
  Administered 2022-03-24 – 2022-03-27 (×6): 1 via ORAL
  Filled 2022-03-24 (×6): qty 1

## 2022-03-24 MED ORDER — CARVEDILOL 3.125 MG PO TABS
3.1250 mg | ORAL_TABLET | Freq: Two times a day (BID) | ORAL | Status: DC
  Administered 2022-03-24 – 2022-03-27 (×6): 3.125 mg via ORAL
  Filled 2022-03-24 (×6): qty 1

## 2022-03-24 MED ORDER — ALBUTEROL SULFATE (2.5 MG/3ML) 0.083% IN NEBU: 2.5000 mg | INHALATION_SOLUTION | RESPIRATORY_TRACT | Status: DC | PRN

## 2022-03-24 MED ORDER — HEPARIN SODIUM (PORCINE) 5000 UNIT/ML IJ SOLN
5000.0000 [IU] | Freq: Three times a day (TID) | INTRAMUSCULAR | Status: DC
  Administered 2022-03-24 – 2022-03-27 (×8): 5000 [IU] via SUBCUTANEOUS
  Filled 2022-03-24 (×8): qty 1

## 2022-03-24 MED ORDER — ASPIRIN 81 MG PO TBEC
81.0000 mg | DELAYED_RELEASE_TABLET | Freq: Every day | ORAL | Status: DC
  Administered 2022-03-24 – 2022-03-27 (×4): 81 mg via ORAL
  Filled 2022-03-24 (×4): qty 1

## 2022-03-24 MED ORDER — SODIUM CHLORIDE 0.9 % IV SOLN: INTRAVENOUS | Status: AC

## 2022-03-24 MED ORDER — ADULT MULTIVITAMIN W/MINERALS CH
1.0000 | ORAL_TABLET | Freq: Every day | ORAL | Status: DC
  Administered 2022-03-24 – 2022-03-27 (×4): 1 via ORAL
  Filled 2022-03-24 (×4): qty 1

## 2022-03-24 MED ORDER — LACTATED RINGERS IV BOLUS
1000.0000 mL | Freq: Once | INTRAVENOUS | Status: AC
  Administered 2022-03-24: 1000 mL via INTRAVENOUS

## 2022-03-24 MED ORDER — ONDANSETRON HCL 4 MG PO TABS: 4.0000 mg | ORAL_TABLET | Freq: Four times a day (QID) | ORAL | Status: DC | PRN

## 2022-03-24 NOTE — ED Notes (Signed)
Pt presents to ED with c/o of a fall that happened on 6/22, pt states he did hit his head, pt states he only takes a baby ASA a day, no blood thinners noted, pt reports he had a lac tot he bridge of his nose, this is not observed at this time. Pt states he just wanted to make sure "my brain wasn't bleeding". Pt denies any episodes of confusion to this RN. Pt is currently A&Ox4 at this time. Pt denies LOC during his fall on 6/22. Pt does also endorse a headache that has been intermittent since his fall and also states this is what brought him to the ED. Pt does also endorse his headache does get better with OTC medications.   Family at bedside.

## 2022-03-24 NOTE — ED Provider Notes (Signed)
[facility] Provider Note    Event Date/Time   First MD Initiated Contact with Patient 03/24/22 1217     (approximate)   History   Chief Complaint Fall   HPI  Jose Porter is a 88 y.o. male with past medical history of hypertension who presents to the ED complaining of generalized weakness.  Patient reports that about 2 weeks ago he rolled out of bed and struck his head.  He reports feeling dizzy with the fall, but did not lose consciousness.  He states that he had significant bleeding from his nose and lip, but did not initially seek medical care.  Since then, he had been dealing with intermittent throbbing headaches over both sides of his head.  He states that despite this, he was eating and drinking normally and denies any associated abdominal pain, nausea, vomiting, or diarrhea.  He denies any fevers, cough, chest pain, shortness of breath, or dysuria.  He went to his PCPs office earlier today for evaluation and was referred to the ED for CT scan.     Physical Exam   Triage Vital Signs: ED Triage Vitals  Enc Vitals Group     BP 03/24/22 1127 118/66     Pulse Rate 03/24/22 1127 75     Resp 03/24/22 1127 16     Temp 03/24/22 1127 97.8 F (36.6 C)     Temp Source 03/24/22 1127 Oral     SpO2 03/24/22 1127 96 %     Weight --      Height --      Head Circumference --      Peak Flow --      Pain Score 03/24/22 1110 5     Pain Loc --      Pain Edu? --      Excl. in GC? --     Most recent vital signs: Vitals:   03/24/22 1127 03/24/22 1300  BP: 118/66 121/62  Pulse: 75 67  Resp: 16 20  Temp: 97.8 F (36.6 C)   SpO2: 96% 100%    Constitutional: Alert and oriented. Eyes: Conjunctivae are normal. Head: No facial bony tenderness or step-offs. Nose: No congestion/rhinnorhea. Mouth/Throat: Mucous membranes are moist.  Neck: No midline cervical spine tenderness to palpation. Cardiovascular: Normal rate, regular rhythm. Grossly normal heart  sounds.  2+ radial pulses bilaterally. Respiratory: Normal respiratory effort.  No retractions. Lungs CTAB. Gastrointestinal: Soft and nontender. No distention. Musculoskeletal: No lower extremity tenderness nor edema.  Neurologic:  Normal speech and language. No gross focal neurologic deficits are appreciated.    ED Results / Procedures / Treatments   Labs (all labs ordered are listed, but only abnormal results are displayed) Labs Reviewed  BASIC METABOLIC PANEL - Abnormal; Notable for the following components:      Result Value   Sodium 131 (*)    CO2 19 (*)    Glucose, Bld 124 (*)    BUN 39 (*)    Creatinine, Ser 2.70 (*)    GFR, Estimated 22 (*)    All other components within normal limits  CBC - Abnormal; Notable for the following components:   RBC 4.01 (*)    Hemoglobin 12.1 (*)    HCT 35.8 (*)    All other components within normal limits  URINALYSIS, ROUTINE W REFLEX MICROSCOPIC  MAGNESIUM  TSH  CBG MONITORING, ED     EKG  ED ECG REPORT I, Chesley Noon, the attending physician, personally viewed and interpreted  this ECG.   Date: 03/24/2022  EKG Time: 11:16  Rate: 79  Rhythm: normal sinus rhythm  Axis: Normal  Intervals:right bundle branch block  ST&T Change: None  RADIOLOGY CT head reviewed and interpreted by me with no hemorrhage or midline shift.  CT cervical spine reviewed and interpreted by me with no fracture or dislocation.  PROCEDURES:  Critical Care performed: No  Procedures   MEDICATIONS ORDERED IN ED: Medications  lactated ringers bolus 1,000 mL (1,000 mLs Intravenous Bolus 03/24/22 1336)     IMPRESSION / MDM / ASSESSMENT AND PLAN / ED COURSE  I reviewed the triage vital signs and the nursing notes.                              88 y.o. male with past medical history of hypertension who presents to the ED complaining of generalized weakness for the past 4 to 5 days after falling and hitting his head about 2 weeks ago.  Patient's  presentation is most consistent with acute presentation with potential threat to life or bodily function.  Differential diagnosis includes, but is not limited to, intracranial hemorrhage, cervical spine injury, dehydration, AKI, electrolyte abnormality, anemia, UTI, pneumonia.  Patient nontoxic-appearing and in no acute distress, vital signs are unremarkable and he has a nonfocal neurologic exam.  CT head and cervical spine are negative for acute traumatic injury however labs are concerning for AKI.  No significant associated electrolyte abnormality and no significant associated anemia.  AKI may be related to dehydration versus recent NSAID use or course of Bactrim.  We will hydrate with IV fluids, screen chest x-ray and urinalysis for contributing infection, and plan to discuss with hospitalist for admission.  Chest x-ray is unremarkable, on reassessment patient admits to taking 600 mg of ibuprofen as much as 3 times a day over the past 1-2 weeks.  It also appears he was on a 21-day course of Bactrim recently, both of which could have contributed to his AKI.  Case discussed with hospitalist for admission.      FINAL CLINICAL IMPRESSION(S) / ED DIAGNOSES   Final diagnoses:  AKI (acute kidney injury) (HCC)     Rx / DC Orders   ED Discharge Orders     None        Note:  This document was prepared using Dragon voice recognition software and may include unintentional dictation errors.   Chesley Noon, MD 03/24/22 1421

## 2022-03-24 NOTE — H&P (Signed)
History and Physical    Jose Porter XBL:[id number] DOB: 30-Jul-1929 DOA: 03/24/2022  PCP: Dione Housekeeper, MD  Patient coming from: pcp office  I have personally briefly reviewed patient's old medical records in [facility]  Chief Complaint: HA s/p fall over one week ago  HPI: Jose Porter is a 88 y.o. male with medical history significant of  HTN, CKDIII, HLD,BPH,who has interim history of prostatitis for which he was on bactrim x 3 weeks. Patient presents to ED in referral from PCP office for evaluation of HA since fall with head trauma on 03/13/22.  Patient notes no associated n/v/d/dysuria/ cough /fever chills/ dizziness/chest pain/ focal weakness , paresthesias or vision changes. He notes since his HA started he has taken Ibuprofen 3 times a day. He notes some relief but states HA recurs.  ED Course:  In ED evaluation notable for negative neuro imaging but labs notable for AKI thought to be due to nsaid use as well as bactrim use and dehydration. Vitals:  RAQ:TMAUQJ sinus , rbb,  q in inferior leads Labs Wbc 4.5, hgb 12.1 plt 181  NA 131, k 4.4, c r 2.7 was 1.5 mag 2.2 Tsh 3.5 UA neg  CTH: NAD Cxr NAD  Review of Systems: As per HPI otherwise 10 point review of systems negative.   Past Medical History:  Diagnosis Date   Hypertension     Past Surgical History:  Procedure Laterality Date   HERNIA REPAIR       reports that he has never smoked. He has never used smokeless tobacco. He reports that he does not drink alcohol and does not use drugs.  No Known Allergies  No family history on file. noncontributory Prior to Admission medications   Medication Sig Start Date End Date Taking? Authorizing Provider  Calcium Carb-Cholecalciferol 600-10 MG-MCG CAPS Take 1 capsule by mouth 2 (two) times daily. 08/25/08  Yes [provider]  carvedilol (COREG) 3.125 MG tablet Take 3.125 mg by mouth 2 (two) times daily. 03/07/22  Yes [provider]   finasteride (PROSCAR) 5 MG tablet Take 5 mg by mouth daily. 02/27/22  Yes [provider]  hydrochlorothiazide (HYDRODIURIL) 25 MG tablet Take 25 mg by mouth daily. 01/27/22  Yes [provider]  Multiple Vitamin (MULTIVITAMIN) capsule Take 1 capsule by mouth daily.   Yes [provider]  POTASSIUM PHOSPHATE PO Take 1 tablet by mouth daily.   Yes [provider]  simvastatin (ZOCOR) 20 MG tablet Take 20 mg by mouth at bedtime. 03/03/22  Yes [provider]  Study - CAPTIVA - aspirin 81 mg tablet (PI-Sethi) Chew 1 tablet by mouth daily. 08/25/08  Yes [provider]  sulfamethoxazole-trimethoprim (BACTRIM DS) 800-160 MG tablet Take 1 tablet by mouth 2 (two) times daily. 03/04/22  Yes [provider]  vitamin B-12 (CYANOCOBALAMIN) 1000 MCG tablet Take 1 tablet by mouth daily.   Yes [provider]  diclofenac Sodium (VOLTAREN) 1 % GEL Apply 2 g topically 2 (two) times daily. Patient not taking: Reported on 03/24/2022 10/21/21   [provider]    Physical Exam: Vitals:   03/24/22 1127 03/24/22 1300 03/24/22 1330  BP: 118/66 121/62 108/63  Pulse: 75 67 67  Resp: 16 20 (!) 23  Temp: 97.8 F (36.6 C)    TempSrc: Oral    SpO2: 96% 100% 100%     Vitals:   03/24/22 1127 03/24/22 1300 03/24/22 1330  BP: 118/66 121/62 108/63  Pulse: 75 67 67  Resp: 16 20 (!) 23  Temp: 97.8 F (36.6 C)    TempSrc: Oral    SpO2: 96% 100% 100%  Constitutional: NAD, calm, comfortable Eyes: PERRL, lids and conjunctivae normal ENMT: Mucous membranes are dry. Posterior pharynx clear of any exudate or lesions.Normal dentition.  Neck: normal, supple, no masses, no thyromegaly Respiratory: clear to auscultation bilaterally, no wheezing, no crackles. Normal respiratory effort. No accessory muscle use.  Cardiovascular: Regular rate and rhythm, no murmurs / rubs / gallops. No extremity edema. 2+ pedal pulses. Abdomen: no tenderness, no masses  palpated. No hepatosplenomegaly. Bowel sounds positive.  Musculoskeletal: no clubbing / cyanosis. No joint deformity upper and lower extremities. Good ROM, no contractures. Normal muscle tone.  Skin: no rashes, lesions, ulcers. No induration Neurologic: CN 2-12 grossly intact. Sensation intact, . Strength 5/5 in all 4.  Psychiatric: Normal judgment and insight. Alert and oriented x 3. Normal mood.    Labs on Admission: I have personally reviewed following labs and imaging studies  CBC: Recent Labs  Lab 03/24/22 1120  WBC 4.5  HGB 12.1*  HCT 35.8*  MCV 89.3  PLT 181   Basic Metabolic Panel: Recent Labs  Lab 03/24/22 1120  NA 131*  K 4.4  CL 103  CO2 19*  GLUCOSE 124*  BUN 39*  CREATININE 2.70*  CALCIUM 8.9  MG 2.2   GFR: CrCl cannot be calculated (Unknown ideal weight.). Liver Function Tests: No results for input(s): "AST", "ALT", "ALKPHOS", "BILITOT", "PROT", "ALBUMIN" in the last 168 hours. No results for input(s): "LIPASE", "AMYLASE" in the last 168 hours. No results for input(s): "AMMONIA" in the last 168 hours. Coagulation Profile: No results for input(s): "INR", "PROTIME" in the last 168 hours. Cardiac Enzymes: No results for input(s): "CKTOTAL", "CKMB", "CKMBINDEX", "TROPONINI" in the last 168 hours. BNP (last 3 results) No results for input(s): "PROBNP" in the last 8760 hours. HbA1C: No results for input(s): "HGBA1C" in the last 72 hours. CBG: No results for input(s): "GLUCAP" in the last 168 hours. Lipid Profile: No results for input(s): "CHOL", "HDL", "LDLCALC", "TRIG", "CHOLHDL", "LDLDIRECT" in the last 72 hours. Thyroid Function Tests: Recent Labs    03/24/22 1120  TSH 3.556   Anemia Panel: No results for input(s): "VITAMINB12", "FOLATE", "FERRITIN", "TIBC", "IRON", "RETICCTPCT" in the last 72 hours. Urine analysis:    Component Value Date/Time   COLORURINE YELLOW (A) 03/24/2022 1513   APPEARANCEUR CLEAR (A) 03/24/2022 1513   LABSPEC 1.010  03/24/2022 1513   PHURINE 5.0 03/24/2022 1513   GLUCOSEU NEGATIVE 03/24/2022 1513   HGBUR NEGATIVE 03/24/2022 1513   BILIRUBINUR NEGATIVE 03/24/2022 1513   KETONESUR NEGATIVE 03/24/2022 1513   PROTEINUR NEGATIVE 03/24/2022 1513   NITRITE NEGATIVE 03/24/2022 1513   LEUKOCYTESUR NEGATIVE 03/24/2022 1513    Radiological Exams on Admission: DG Chest 2 View  Result Date: 03/24/2022 CLINICAL DATA:  Weakness EXAM: CHEST - 2 VIEW COMPARISON:  None available FINDINGS: The cardiomediastinal silhouette is within normal limits. There is no focal airspace consolidation. There is no pleural effusion. No pneumothorax. There is no acute osseous abnormality. Bilateral shoulder degenerative changes. Thoracic spondylosis. IMPRESSION: No evidence of acute cardiopulmonary disease. Electronically Signed   By: Caprice Renshaw M.D.   On: 03/24/2022 13:30   CT HEAD WO CONTRAST ( )  Result Date: 03/24/2022 CLINICAL DATA:  Fall, possible concussion. EXAM: CT HEAD WITHOUT CONTRAST CT CERVICAL SPINE WITHOUT CONTRAST TECHNIQUE: Multidetector CT imaging of the head and cervical spine was performed following the standard protocol without intravenous contrast. Multiplanar  CT image reconstructions of the cervical spine were also generated. RADIATION DOSE REDUCTION: This exam was performed according to the departmental dose-optimization program which includes automated exposure control, adjustment of the mA and/or kV according to patient size and/or use of iterative reconstruction technique. COMPARISON:  CT examination dated October 09, 2021 FINDINGS: CT HEAD FINDINGS Brain: No evidence of acute infarction, hemorrhage, hydrocephalus, extra-axial collection or mass lesion/mass effect. Prominence of the ventricles and sulci secondary to mild cerebral volume loss. Low-attenuation of the periventricular and subcortical white matter presumed chronic microvascular ischemic changes. Lacunar infarct of the left thalamus, unchanged. Vascular: No  hyperdense vessel or unexpected calcification. Skull: Normal. Negative for fracture or focal lesion. Sinuses/Orbits: No acute finding. Mucosal thickening of the maxillary sinuses. Patchy opacification of the right ethmoid air cells. Other: None. CT CERVICAL SPINE FINDINGS Alignment: Reversal of normal cervical lordosis. Skull base and vertebrae: No acute fracture. No primary bone lesion or focal pathologic process. Soft tissues and spinal canal: No prevertebral fluid or swelling. No visible canal hematoma. Disc levels: C2-C3: No significant spinal canal or neural foraminal stenosis. Moderate bilateral facet joint arthropathy, right worse than the left. C3-C4: Disc height loss with mild to moderate spinal canal stenosis. Uncovertebral joint arthropathy and facet joint arthropathy with moderate bilateral neural foraminal stenosis. C4-C5: Disc height loss and disc osteophyte complex with spinal canal stenosis. Uncovertebral joint arthropathy with moderate bilateral neural foraminal stenosis. C5-C6: Disc height loss and uncovertebral joint arthropathy. No significant spinal canal stenosis. Mild left neural foraminal stenosis. C6-C7: Disc height loss and disc osteophyte complex. Mild spinal canal stenosis. No significant neural foraminal stenosis. C7-T1: Disc osteophyte complex. No significant spinal canal stenosis. Mild bilateral neural foraminal stenosis. Upper chest: Negative. Other: None IMPRESSION: CT head: 1.  No acute intracranial abnormality. 2. 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 3.  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 CT cervical spine: 1.  No acute fracture or traumatic subluxation. 2. Advanced degenerative changes with multilevel uncovertebral joint and facet joint arthropathy with associated neural foraminal stenosis as detailed above. Electronically Signed   By: Larose Hires D.O.   On:  03/24/2022 11:58   CT Cervical Spine Wo Contrast  Result Date: 03/24/2022 CLINICAL DATA:  Fall, possible concussion. EXAM: CT HEAD WITHOUT CONTRAST CT CERVICAL SPINE WITHOUT CONTRAST TECHNIQUE: Multidetector CT imaging of the head and cervical spine was performed following the standard protocol without intravenous contrast. Multiplanar CT image reconstructions of the cervical spine were also generated. RADIATION DOSE REDUCTION: This exam was performed according to the departmental dose-optimization program which includes automated exposure control, adjustment of the mA and/or kV according to patient size and/or use of iterative reconstruction technique. COMPARISON:  CT examination dated October 09, 2021 FINDINGS: CT HEAD FINDINGS Brain: No evidence of acute infarction, hemorrhage, hydrocephalus, extra-axial collection or mass lesion/mass effect. Prominence of the ventricles and sulci secondary to mild cerebral volume loss. Low-attenuation of the periventricular and subcortical white matter presumed chronic microvascular ischemic changes. Lacunar infarct of the left thalamus, unchanged. Vascular: No hyperdense vessel or unexpected calcification. Skull: Normal. Negative for fracture or focal lesion. Sinuses/Orbits: No acute finding. Mucosal thickening of the maxillary sinuses. Patchy opacification of the right ethmoid air cells. Other: None. CT CERVICAL SPINE FINDINGS Alignment: Reversal of normal cervical lordosis. Skull base and vertebrae: No acute fracture. No primary bone lesion or focal pathologic process. Soft tissues and spinal canal: No prevertebral fluid or swelling.  No visible canal hematoma. Disc levels: C2-C3: No significant spinal canal or neural foraminal stenosis. Moderate bilateral facet joint arthropathy, right worse than the left. C3-C4: Disc height loss with mild to moderate spinal canal stenosis. Uncovertebral joint arthropathy and facet joint arthropathy with moderate bilateral neural foraminal  stenosis. C4-C5: Disc height loss and disc osteophyte complex with spinal canal stenosis. Uncovertebral joint arthropathy with moderate bilateral neural foraminal stenosis. C5-C6: Disc height loss and uncovertebral joint arthropathy. No significant spinal canal stenosis. Mild left neural foraminal stenosis. C6-C7: Disc height loss and disc osteophyte complex. Mild spinal canal stenosis. No significant neural foraminal stenosis. C7-T1: Disc osteophyte complex. No significant spinal canal stenosis. Mild bilateral neural foraminal stenosis. Upper chest: Negative. Other: None IMPRESSION: CT head: 1.  No acute intracranial abnormality. 2. 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 3.  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 CT cervical spine: 1.  No acute fracture or traumatic subluxation. 2. Advanced degenerative changes with multilevel uncovertebral joint and facet joint arthropathy with associated neural foraminal stenosis as detailed above. Electronically Signed   By: Larose Hires D.O.   On: 03/24/2022 11:58    EKG: Independently reviewed.  Assessment/Plan  AKI on CKDIIIb -in setting of bactrim use for prostatitis, hctz, and Ibuprofen -hold nephrotoxic medications  - ivfs overnight strict I/o -urine eos -consider nephrology consult if renal function  not improved     Post concussive syndrome -patient with fall 03/13/22 - HA since fall -neuro-imaging negative  -neuro exam nonfocal  - supportive care with tylenol for now  -consider low dose neuropathic agent/ may need referral to neurology if HA are persistent   HTN -stable  -hold hctz due to AKI -continue carvedilol  HLD -continue statin   Prostatitis  -s/p 3 week course of bactrim  - last dose of bactrim this am    DVT prophylaxis: heparin  Code Status: full Family Communication: family at bedside [place], [place] (Daughter)   [phone] (Mobile) Disposition Plan: patient  expected to be admitted less than 2 midnights  Consults called: n/a Admission status: med tele   Lurline Del MD [facility]   If 7PM-7AM, please contact night-coverage [email or web address] Password [email or web address]  03/24/2022, 3:35 PM

## 2022-03-24 NOTE — ED Notes (Signed)
Informed RN bed assigned 

## 2022-03-24 NOTE — ED Triage Notes (Addendum)
Pt comes with c/o possible concussion and bleeding on brain. Pt states he fell out of bed end of June. Pt states laceration to nose. Pt states headache and dizziness. Pt takes aspirin daily.  Pt states the headache is little better since taking meds for it. Pt states some right shoulder pain as well.

## 2022-03-25 ENCOUNTER — Encounter: Payer: Self-pay | Admitting: Internal Medicine

## 2022-03-25 DIAGNOSIS — N1831 Chronic kidney disease, stage 3a: Secondary | ICD-10-CM

## 2022-03-25 DIAGNOSIS — S060XAA Concussion with loss of consciousness status unknown, initial encounter: Secondary | ICD-10-CM

## 2022-03-25 DIAGNOSIS — N170 Acute kidney failure with tubular necrosis: Secondary | ICD-10-CM | POA: Diagnosis not present

## 2022-03-25 DIAGNOSIS — W19XXXA Unspecified fall, initial encounter: Secondary | ICD-10-CM

## 2022-03-25 DIAGNOSIS — S060X0A Concussion without loss of consciousness, initial encounter: Secondary | ICD-10-CM | POA: Diagnosis not present

## 2022-03-25 DIAGNOSIS — I10 Essential (primary) hypertension: Secondary | ICD-10-CM

## 2022-03-25 LAB — BASIC METABOLIC PANEL
Anion gap: 7 (ref 5–15)
BUN: 34 mg/dL — ABNORMAL HIGH (ref 8–23)
CO2: 21 mmol/L — ABNORMAL LOW (ref 22–32)
Calcium: 9 mg/dL (ref 8.9–10.3)
Chloride: 105 mmol/L (ref 98–111)
Creatinine, Ser: 2.11 mg/dL — ABNORMAL HIGH (ref 0.61–1.24)
GFR, Estimated: 29 mL/min — ABNORMAL LOW (ref >60)
Glucose, Bld: 122 mg/dL — ABNORMAL HIGH (ref 70–99)
Potassium: 4.7 mmol/L (ref 3.5–5.1)
Sodium: 133 mmol/L — ABNORMAL LOW (ref 135–145)

## 2022-03-25 LAB — CBC
HCT: 34 % — ABNORMAL LOW (ref 39.0–52.0)
Hemoglobin: 11.6 g/dL — ABNORMAL LOW (ref 13.0–17.0)
MCH: 30 pg (ref 26.0–34.0)
MCHC: 34.1 g/dL (ref 30.0–36.0)
MCV: 87.9 fL (ref 80.0–100.0)
Platelets: 176 10*3/uL (ref 150–400)
RBC: 3.87 MIL/uL — ABNORMAL LOW (ref 4.22–5.81)
RDW: 14.3 % (ref 11.5–15.5)
WBC: 5 10*3/uL (ref 4.0–10.5)
nRBC: 0 % (ref 0.0–0.2)

## 2022-03-25 NOTE — Progress Notes (Signed)
  Progress Note   Patient: Jose Porter FBP:[id number] DOB: 01-24-1929 DOA: 03/24/2022     1 DOS: the patient was seen and examined on 03/25/2022   Brief hospital course: Jose Porter is a 88 y.o. male with medical history significant of  HTN, CKDIII, HLD,BPH,who has interim history of prostatitis for which he was on bactrim x 3 weeks.  Came to the hospital with a diagnosis of acute on chronic renal failure.  Baseline creatinine was 1.4.  Started on IV fluids  Assessment and Plan: Acute renal failure superimposed on chronic kidney disease stage IIIa. ATN. Patient baseline creatinine was 1.4 based on lab results in care everywhere.  Acute renal failure is caused by Bactrim, most likely ATN. Patient is placed on IV fluids, renal function started getting better. Patient lungs have crackles in the bases, but does not feel short of breath, no hypoxia.  Chest x-ray did not show any vascular congestion. We will continue IV fluids, but reduce to 50 mL/h.  Patient does not seem to be dehydrated. Check a BNP tomorrow  Recent fall. Brain concussion with headaches. CT head and neck did not show acute changes.  Continue symptomatic treatment.  Essential hypertension. Continue Coreg, discontinue HCTZ.  Recent prostatitis. Finished 3 weeks of course of Bactrim.      Subjective:  Patient is doing well, denies any short of breath.  No cough.   Physical Exam: Vitals:   03/25/22 0500 03/25/22 0616 03/25/22 0618 03/25/22 0820  BP:  (!) 89/46 (!) 111/56 120/61  Pulse:  82 83 80  Resp:    19  Temp:    98.8 F (37.1 C)  TempSrc:      SpO2:    95%  Weight: 87.5 kg      General exam: Appears calm and comfortable  Respiratory system: Crackles in bilateral bases. Respiratory effort normal. Cardiovascular system: S1 & S2 heard, RRR. No JVD, murmurs, rubs, gallops or clicks. No pedal edema. Gastrointestinal system: Abdomen is nondistended, soft and nontender. No organomegaly or masses felt.  Normal bowel sounds heard. Central nervous system: Alert and oriented. No focal neurological deficits. Extremities: Symmetric 5 x 5 power. Skin: No rashes, lesions or ulcers Psychiatry: Judgement and insight appear normal. Mood & affect appropriate.   Data Reviewed:  Had a CT scan, chest x-ray. Reviewed all lab results.  Family Communication: Not able to reach daughter  Disposition: Status is: Inpatient Remains inpatient appropriate because: severity of disease, iv treatment  Planned Discharge Destination: Home with Home Health    Time spent: 50 minutes  Author: Marrion Coy, MD 03/25/2022 10:58 AM  For on call review [email or web address].

## 2022-03-25 NOTE — Hospital Course (Signed)
Jose Porter is a 88 y.o. male with medical history significant of  HTN, CKDIII, HLD,BPH,who has interim history of prostatitis for which he was on bactrim x 3 weeks.  Came to the hospital with a diagnosis of acute on chronic renal failure.  Baseline creatinine was 1.4.  Started on IV fluids

## 2022-03-26 ENCOUNTER — Encounter: Payer: Self-pay | Admitting: Internal Medicine

## 2022-03-26 DIAGNOSIS — N1831 Chronic kidney disease, stage 3a: Secondary | ICD-10-CM | POA: Diagnosis not present

## 2022-03-26 DIAGNOSIS — E871 Hypo-osmolality and hyponatremia: Secondary | ICD-10-CM | POA: Diagnosis present

## 2022-03-26 DIAGNOSIS — N170 Acute kidney failure with tubular necrosis: Secondary | ICD-10-CM | POA: Diagnosis not present

## 2022-03-26 LAB — BASIC METABOLIC PANEL
Anion gap: 7 (ref 5–15)
BUN: 32 mg/dL — ABNORMAL HIGH (ref 8–23)
CO2: 23 mmol/L (ref 22–32)
Calcium: 8.2 mg/dL — ABNORMAL LOW (ref 8.9–10.3)
Chloride: 102 mmol/L (ref 98–111)
Creatinine, Ser: 1.52 mg/dL — ABNORMAL HIGH (ref 0.61–1.24)
GFR, Estimated: 43 mL/min — ABNORMAL LOW (ref >60)
Glucose, Bld: 120 mg/dL — ABNORMAL HIGH (ref 70–99)
Potassium: 4.4 mmol/L (ref 3.5–5.1)
Sodium: 132 mmol/L — ABNORMAL LOW (ref 135–145)

## 2022-03-26 LAB — BRAIN NATRIURETIC PEPTIDE: B Natriuretic Peptide: 126.8 pg/mL — ABNORMAL HIGH (ref 0.0–100.0)

## 2022-03-26 MED ORDER — HYDROCORTISONE 1 % EX CREA
TOPICAL_CREAM | Freq: Two times a day (BID) | CUTANEOUS | Status: DC
  Filled 2022-03-26: qty 28

## 2022-03-26 MED ORDER — SODIUM CHLORIDE 0.9 % IV SOLN
INTRAVENOUS | Status: DC
Start: 2022-03-26 — End: 2022-03-27

## 2022-03-26 NOTE — Progress Notes (Signed)
Pt provides verbal consent to provide information and address questions with his daughter Dois Davenport.

## 2022-03-26 NOTE — Evaluation (Signed)
Physical Therapy Evaluation Patient Details Name: Jose Porter MRN: [id number] DOB: 27-Jun-1929 Today's Date: 03/26/2022  History of Present Illness  Patient is a 88 year old male with acute renal failure superimposed on chronic kidney disease, recent fall with concussion and headaches. Medical history significant for  HTN, CKDIII, HLD,BPH.   Clinical Impression  Patient is agreeable to PT. He reports he uses a cane most of the time for ambulation with occasional use of 4 wheeled walker in the community.  Today, the patient was able to get out of bed with supervision and ambulate in hallway using the rolling walker. He needs occasional cues for safety and technique with positioning rolling walker closer to base of support. He reports feeling mildly weaker than his baseline and with a recent fall out of bed at home. Recommend PT follow up to maximize independence and facilitate return to prior level of function. HHPT recommended at discharge, although patient reports he would like to think about it before agreeing to home health services.       Recommendations for follow up therapy are one component of a multi-disciplinary discharge planning process, led by the attending physician.  Recommendations may be updated based on patient status, additional functional criteria and insurance authorization.  Follow Up Recommendations Home health PT      Assistance Recommended at Discharge PRN  Patient can return home with the following  Help with stairs or ramp for entrance;Assist for transportation    Equipment Recommendations None recommended by PT  Recommendations for Other Services       Functional Status Assessment Patient has had a recent decline in their functional status and demonstrates the ability to make significant improvements in function in a reasonable and predictable amount of time.     Precautions / Restrictions Precautions Precautions: Fall Restrictions Weight Bearing  Restrictions: No      Mobility  Bed Mobility Overal bed mobility: Needs Assistance Bed Mobility: Supine to Sit, Sit to Supine     Supine to sit: Min guard Sit to supine: Supervision   General bed mobility comments: increased time required with occasional cues for technique    Transfers Overall transfer level: Needs assistance Equipment used: Rolling walker (2 wheels) Transfers: Sit to/from Stand Sit to Stand: Supervision, From elevated surface           General transfer comment: verbal cues for hand placement for safety    Ambulation/Gait Ambulation/Gait assistance: Min guard, Supervision Gait Distance (Feet): 125 Feet Assistive device: Rolling walker (2 wheels) Gait Pattern/deviations: Trunk flexed, Decreased stride length, Step-through pattern Gait velocity: decreased     General Gait Details: cues to stand closer to rolling walker for support. no loss of balance with ambulation. encouraged patient to use rolling walker for increased support at this time instead of cane  Stairs            Wheelchair Mobility    Modified Rankin (Stroke Patients Only)       Balance Overall balance assessment: Needs assistance Sitting-balance support: Feet supported Sitting balance-Leahy Scale: Fair Sitting balance - Comments: no loss of balance while sitting   Standing balance support: Bilateral upper extremity supported, Reliant on assistive device for balance Standing balance-Leahy Scale: Fair                               Pertinent Vitals/Pain Pain Assessment Pain Assessment: No/denies pain    Home Living Family/patient expects to be discharged  to:: Private residence Living Arrangements: Spouse/significant other;Children (spouse and daughter) Available Help at Discharge: Family Type of Home: House Home Access: Stairs to enter   Secretary/administrator of Steps: 2 Alternate Level Stairs-Number of Steps: 12-14 Home Layout: Two level;Bed/bath  upstairs Home Equipment: Cane - single point;Rollator (4 wheels)      Prior Function Prior Level of Function : Independent/Modified Independent;History of Falls (last six months)             Mobility Comments: patient fell out of bed recently but reports no recent falls from standing. he walks using a cane mostly with occasional use of 4 wheeled walker in community. he reports he crawls up the steps to get to his upstairs bedroom ADLs Comments: independent     Hand Dominance        Extremity/Trunk Assessment   Upper Extremity Assessment Upper Extremity Assessment: Overall WFL for tasks assessed    Lower Extremity Assessment Lower Extremity Assessment: Generalized weakness    Cervical / Trunk Assessment Cervical / Trunk Assessment: Kyphotic  Communication   Communication: No difficulties  Cognition Arousal/Alertness: Awake/alert Behavior During Therapy: WFL for tasks assessed/performed Overall Cognitive Status: Within Functional Limits for tasks assessed                                          General Comments General comments (skin integrity, edema, etc.): blood pressure 110/66 with sitting with no dizziness reported with upright activity    Exercises     Assessment/Plan    PT Assessment Patient needs continued PT services  PT Problem List Decreased strength;Decreased activity tolerance;Decreased mobility;Decreased balance;Decreased safety awareness       PT Treatment Interventions DME instruction;Gait training;Stair training;Functional mobility training;Therapeutic activities;Therapeutic exercise;Balance training;Neuromuscular re-education;Cognitive remediation;Patient/family education    PT Goals (Current goals can be found in the Care Plan section)  Acute Rehab PT Goals Patient Stated Goal: to return home PT Goal Formulation: With patient Time For Goal Achievement: 04/09/22 Potential to Achieve Goals: Good    Frequency Min 2X/week      Co-evaluation               AM-PAC PT "6 Clicks" Mobility  Outcome Measure Help needed turning from your back to your side while in a flat bed without using bedrails?: A Little Help needed moving from lying on your back to sitting on the side of a flat bed without using bedrails?: A Little Help needed moving to and from a bed to a chair (including a wheelchair)?: A Little Help needed standing up from a chair using your arms (e.g., wheelchair or bedside chair)?: A Little Help needed to walk in hospital room?: A Little Help needed climbing 3-5 steps with a railing? : A Little 6 Click Score: 18    End of Session Equipment Utilized During Treatment: Gait belt Activity Tolerance: Patient tolerated treatment well Patient left: in bed;with call bell/phone within reach;with bed alarm set   PT Visit Diagnosis: Unsteadiness on feet (R26.81);Muscle weakness (generalized) (M62.81)    Time: 9629-5284 PT Time Calculation (min) (ACUTE ONLY): 25 min   Charges:   PT Evaluation $PT Eval Low Complexity: 1 Low PT Treatments $Gait Training: 8-22 mins        Donna Bernard, PT, MPT   Ina Homes 03/26/2022, 1:08 PM

## 2022-03-26 NOTE — Plan of Care (Signed)

## 2022-03-26 NOTE — Progress Notes (Addendum)
Progress Note    Jose Porter  [id number] DOB: [birth year]  DOA: 03/24/2022 PCP: Valera Castle, MD      Brief Narrative:    Medical records reviewed and are as summarized below:   Jose Porter is a 88 y.o. male with medical history significant for HTN, CKD stage IIIa, HLD,BPH,who has interim history of prostatitis for which he was on bactrim x 3 weeks.  He presented to the hospital because of generalized weakness.  He also reported history of fall about 2 weeks prior to admission, when he rolled out of bed and struck his head.  He bled from his nose and his lip.  However, he did not seek medical attention.  He had been experiencing intermittent headaches since then.  He was found to have AKI on CKD stage III.  He was treated with IV fluids.  AKI was probably from recent use of Bactrim.  He was also found to have diffuse erythematous rash on the trunk and extremities.  Etiology of rash is not clear.     Assessment/Plan:   Principal Problem:   Acute renal failure superimposed on stage 3a chronic kidney disease (HCC) Active Problems:   Brain concussion   Fall at home, initial encounter   Essential hypertension   ATN (acute tubular necrosis) (HCC)    Body mass index is 27.43 kg/m.   AKI on CKD stage IIIa: Creatinine is slowly improving.  Discontinue IV fluids later today. Baseline creatinine is around 1.4.  Repeat BMP tomorrow.  Hyponatremia: Sodium is slightly improved.  Repeat BMP tomorrow.  Diffuse erythematous, not itchy rash on the trunk and extremities: Patient was not aware that he had a rash on his body.  He said it is not itchy.  Etiology unclear.  Unknown if it was from recent use of Bactrim.  Monitor overnight.  Recent fall.  Concussion and headache: No acute abnormality on CT head.  Recent prostatitis: Completed 3-week course of Bactrim just prior to admission.  History of hypertension, recent hypotension: Continue Coreg.  HCTZ on  hold.  Diet Order             Diet Heart Room service appropriate? Yes; Fluid consistency: Thin  Diet effective now                            Consultants: None  Procedures: None    Medications:    aspirin EC  81 mg Oral Daily   calcium-vitamin D  1 tablet Oral BID   carvedilol  3.125 mg Oral BID   finasteride  5 mg Oral Daily   heparin  5,000 Units Subcutaneous Q8H   multivitamin with minerals  1 tablet Oral Daily   simvastatin  20 mg Oral QHS   vitamin B-12  1,000 mcg Oral Daily   Continuous Infusions:   Anti-infectives (From admission, onward)    None              Family Communication/Anticipated D/C date and plan/Code Status   DVT prophylaxis: heparin injection 5,000 Units Start: 03/24/22 2200     Code Status: Full Code  Family Communication: None Disposition Plan: Plan to discharge home tomorrow   Status is: Inpatient Remains inpatient appropriate because: New rash, AKI       Subjective:   Interval events noted.  No chest pain, cough, shortness of breath or itching.  Objective:    Vitals:   03/25/22 2038 03/26/22 0500  03/26/22 0516 03/26/22 0834  BP: 133/75  (!) 98/49 112/64  Pulse: 80  76 79  Resp: 20  18 16   Temp: 97.6 F (36.4 C)  98.4 F (36.9 C) 98.1 F (36.7 C)  TempSrc: Oral     SpO2: 99%  96% 93%  Weight:  86.7 kg     No data found.   Intake/Output Summary (Last 24 hours) at 03/26/2022 1036 Last data filed at 03/26/2022 1029 Gross per 24 hour  Intake 720 ml  Output 1350 ml  Net -630 ml   Filed Weights   03/25/22 0500 03/26/22 0500  Weight: 87.5 kg 86.7 kg    Exam:  GEN: NAD SKIN: Erythematous rash more prominent on the right upper chest and right upper back.  He also has erythematous rash on the abdomen, upper and lower extremities EYES: EOMI ENT: MMM CV: RRR PULM: Bibasilar rales, no wheezing ABD: soft, ND, NT, +BS CNS: AAO x 3, non focal EXT: No edema or tenderness              Data Reviewed:   I have personally reviewed following labs and imaging studies:  Labs: Labs show the following:   Basic Metabolic Panel: Recent Labs  Lab 03/24/22 1120 03/25/22 0539  NA 131* 133*  K 4.4 4.7  CL 103 105  CO2 19* 21*  GLUCOSE 124* 122*  BUN 39* 34*  CREATININE 2.70* 2.11*  CALCIUM 8.9 9.0  MG 2.2  --    GFR Estimated Creatinine Clearance: 23.5 mL/min (A) (by C-G formula based on SCr of 2.11 mg/dL (H)). Liver Function Tests: No results for input(s): "AST", "ALT", "ALKPHOS", "BILITOT", "PROT", "ALBUMIN" in the last 168 hours. No results for input(s): "LIPASE", "AMYLASE" in the last 168 hours. No results for input(s): "AMMONIA" in the last 168 hours. Coagulation profile No results for input(s): "INR", "PROTIME" in the last 168 hours.  CBC: Recent Labs  Lab 03/24/22 1120 03/25/22 0539  WBC 4.5 5.0  HGB 12.1* 11.6*  HCT 35.8* 34.0*  MCV 89.3 87.9  PLT 181 176   Cardiac Enzymes: No results for input(s): "CKTOTAL", "CKMB", "CKMBINDEX", "TROPONINI" in the last 168 hours. BNP (last 3 results) No results for input(s): "PROBNP" in the last 8760 hours. CBG: No results for input(s): "GLUCAP" in the last 168 hours. D-Dimer: No results for input(s): "DDIMER" in the last 72 hours. Hgb A1c: No results for input(s): "HGBA1C" in the last 72 hours. Lipid Profile: No results for input(s): "CHOL", "HDL", "LDLCALC", "TRIG", "CHOLHDL", "LDLDIRECT" in the last 72 hours. Thyroid function studies: Recent Labs    03/24/22 1120  TSH 3.556   Anemia work up: No results for input(s): "VITAMINB12", "FOLATE", "FERRITIN", "TIBC", "IRON", "RETICCTPCT" in the last 72 hours. Sepsis Labs: Recent Labs  Lab 03/24/22 1120 03/25/22 0539  WBC 4.5 5.0    Microbiology No results found for this or any previous visit (from the past 240 hour(s)).  Procedures and diagnostic studies:  DG Chest 2 View  Result Date: 03/24/2022 CLINICAL DATA:  Weakness EXAM: CHEST - 2 VIEW  COMPARISON:  None available FINDINGS: The cardiomediastinal silhouette is within normal limits. There is no focal airspace consolidation. There is no pleural effusion. No pneumothorax. There is no acute osseous abnormality. Bilateral shoulder degenerative changes. Thoracic spondylosis. IMPRESSION: No evidence of acute cardiopulmonary disease. Electronically Signed   By: Maurine Simmering M.D.   On: 03/24/2022 13:30   CT HEAD WO CONTRAST (5MM)  Result Date: 03/24/2022 CLINICAL DATA:  Fall, possible  concussion. EXAM: CT HEAD WITHOUT CONTRAST CT CERVICAL SPINE WITHOUT CONTRAST TECHNIQUE: Multidetector CT imaging of the head and cervical spine was performed following the standard protocol without intravenous contrast. Multiplanar CT image reconstructions of the cervical spine were also generated. RADIATION DOSE REDUCTION: This exam was performed according to the departmental dose-optimization program which includes automated exposure control, adjustment of the mA and/or kV according to patient size and/or use of iterative reconstruction technique. COMPARISON:  CT examination dated October 09, 2021 FINDINGS: CT HEAD FINDINGS Brain: No evidence of acute infarction, hemorrhage, hydrocephalus, extra-axial collection or mass lesion/mass effect. Prominence of the ventricles and sulci secondary to mild cerebral volume loss. Low-attenuation of the periventricular and subcortical white matter presumed chronic microvascular ischemic changes. Lacunar infarct of the left thalamus, unchanged. Vascular: No hyperdense vessel or unexpected calcification. Skull: Normal. Negative for fracture or focal lesion. Sinuses/Orbits: No acute finding. Mucosal thickening of the maxillary sinuses. Patchy opacification of the right ethmoid air cells. Other: None. CT CERVICAL SPINE FINDINGS Alignment: Reversal of normal cervical lordosis. Skull base and vertebrae: No acute fracture. No primary bone lesion or focal pathologic process. Soft tissues and  spinal canal: No prevertebral fluid or swelling. No visible canal hematoma. Disc levels: C2-C3: No significant spinal canal or neural foraminal stenosis. Moderate bilateral facet joint arthropathy, right worse than the left. C3-C4: Disc height loss with mild to moderate spinal canal stenosis. Uncovertebral joint arthropathy and facet joint arthropathy with moderate bilateral neural foraminal stenosis. C4-C5: Disc height loss and disc osteophyte complex with spinal canal stenosis. Uncovertebral joint arthropathy with moderate bilateral neural foraminal stenosis. C5-C6: Disc height loss and uncovertebral joint arthropathy. No significant spinal canal stenosis. Mild left neural foraminal stenosis. C6-C7: Disc height loss and disc osteophyte complex. Mild spinal canal stenosis. No significant neural foraminal stenosis. C7-T1: Disc osteophyte complex. No significant spinal canal stenosis. Mild bilateral neural foraminal stenosis. Upper chest: Negative. Other: None IMPRESSION: CT head: 1.  No acute intracranial abnormality. 2. 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 3.  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 CT cervical spine: 1.  No acute fracture or traumatic subluxation. 2. Advanced degenerative changes with multilevel uncovertebral joint and facet joint arthropathy with associated neural foraminal stenosis as detailed above. Electronically Signed   By: Larose Hires D.O.   On: 03/24/2022 11:58   CT Cervical Spine Wo Contrast  Result Date: 03/24/2022 CLINICAL DATA:  Fall, possible concussion. EXAM: CT HEAD WITHOUT CONTRAST CT CERVICAL SPINE WITHOUT CONTRAST TECHNIQUE: Multidetector CT imaging of the head and cervical spine was performed following the standard protocol without intravenous contrast. Multiplanar CT image reconstructions of the cervical spine were also generated. RADIATION DOSE REDUCTION:  This exam was performed according to the departmental dose-optimization program which includes automated exposure control, adjustment of the mA and/or kV according to patient size and/or use of iterative reconstruction technique. COMPARISON:  CT examination dated October 09, 2021 FINDINGS: CT HEAD FINDINGS Brain: No evidence of acute infarction, hemorrhage, hydrocephalus, extra-axial collection or mass lesion/mass effect. Prominence of the ventricles and sulci secondary to mild cerebral volume loss. Low-attenuation of the periventricular and subcortical white matter presumed chronic microvascular ischemic changes. Lacunar infarct of the left thalamus, unchanged. Vascular: No hyperdense vessel or unexpected calcification. Skull: Normal. Negative for fracture or focal lesion. Sinuses/Orbits: No acute finding. Mucosal thickening of the maxillary sinuses. Patchy opacification of the right ethmoid air cells. Other: None. CT CERVICAL SPINE FINDINGS  Alignment: Reversal of normal cervical lordosis. Skull base and vertebrae: No acute fracture. No primary bone lesion or focal pathologic process. Soft tissues and spinal canal: No prevertebral fluid or swelling. No visible canal hematoma. Disc levels: C2-C3: No significant spinal canal or neural foraminal stenosis. Moderate bilateral facet joint arthropathy, right worse than the left. C3-C4: Disc height loss with mild to moderate spinal canal stenosis. Uncovertebral joint arthropathy and facet joint arthropathy with moderate bilateral neural foraminal stenosis. C4-C5: Disc height loss and disc osteophyte complex with spinal canal stenosis. Uncovertebral joint arthropathy with moderate bilateral neural foraminal stenosis. C5-C6: Disc height loss and uncovertebral joint arthropathy. No significant spinal canal stenosis. Mild left neural foraminal stenosis. C6-C7: Disc height loss and disc osteophyte complex. Mild spinal canal stenosis. No significant neural foraminal stenosis.  C7-T1: Disc osteophyte complex. No significant spinal canal stenosis. Mild bilateral neural foraminal stenosis. Upper chest: Negative. Other: None IMPRESSION: CT head: 1.  No acute intracranial abnormality. 2. 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 3.  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 CT cervical spine: 1.  No acute fracture or traumatic subluxation. 2. Advanced degenerative changes with multilevel uncovertebral joint and facet joint arthropathy with associated neural foraminal stenosis as detailed above. Electronically Signed   By: Larose Hires D.O.   On: 03/24/2022 11:58               LOS: 2 days   Jose Porter  [facility]   Pager on [email or web address]. If 7PM-7AM, please contact night-coverage at [email or web address]     03/26/2022, 10:36 AM

## 2022-03-27 DIAGNOSIS — E871 Hypo-osmolality and hyponatremia: Secondary | ICD-10-CM

## 2022-03-27 DIAGNOSIS — W19XXXA Unspecified fall, initial encounter: Secondary | ICD-10-CM | POA: Diagnosis not present

## 2022-03-27 DIAGNOSIS — N170 Acute kidney failure with tubular necrosis: Secondary | ICD-10-CM | POA: Diagnosis not present

## 2022-03-27 DIAGNOSIS — Y92009 Unspecified place in unspecified non-institutional (private) residence as the place of occurrence of the external cause: Secondary | ICD-10-CM

## 2022-03-27 DIAGNOSIS — N1831 Chronic kidney disease, stage 3a: Secondary | ICD-10-CM | POA: Diagnosis not present

## 2022-03-27 LAB — BASIC METABOLIC PANEL
Anion gap: 5 (ref 5–15)
BUN: 26 mg/dL — ABNORMAL HIGH (ref 8–23)
CO2: 23 mmol/L (ref 22–32)
Calcium: 8.6 mg/dL — ABNORMAL LOW (ref 8.9–10.3)
Chloride: 105 mmol/L (ref 98–111)
Creatinine, Ser: 1.2 mg/dL (ref 0.61–1.24)
GFR, Estimated: 57 mL/min — ABNORMAL LOW (ref >60)
Glucose, Bld: 109 mg/dL — ABNORMAL HIGH (ref 70–99)
Potassium: 4.3 mmol/L (ref 3.5–5.1)
Sodium: 133 mmol/L — ABNORMAL LOW (ref 135–145)

## 2022-03-27 MED ORDER — VALACYCLOVIR HCL 1 G PO TABS: 1000.0000 mg | ORAL_TABLET | Freq: Two times a day (BID) | ORAL | 0 refills | Status: AC

## 2022-03-27 MED ORDER — VALACYCLOVIR HCL 500 MG PO TABS
1000.0000 mg | ORAL_TABLET | Freq: Two times a day (BID) | ORAL | Status: DC
  Administered 2022-03-27: 1000 mg via ORAL
  Filled 2022-03-27: qty 2

## 2022-03-27 NOTE — Progress Notes (Signed)
Patient discharging home. Vitals signs stable at time of discharge as reflected in discharge summary. Discharge instructions given and verbal understanding returned. No questions at this time. ?

## 2022-03-27 NOTE — Discharge Summary (Signed)
Physician Discharge Summary   Patient: Jose Porter MRN: [id number] DOB: 21-May-1929  Admit date:     03/24/2022  Discharge date: 03/27/2022  Discharge Physician: Lurene Shadow   PCP: Dione Housekeeper, MD   Recommendations at discharge:   Follow up with PCP in 1 week  Discharge Diagnoses: Principal Problem:   Acute renal failure superimposed on stage 3a chronic kidney disease (HCC) Active Problems:   Brain concussion   Fall at home, initial encounter   Essential hypertension   ATN (acute tubular necrosis) (HCC)   Hyponatremia  Resolved Problems:   * No resolved hospital problems. [facility] Course:  Mr. Jose Porter is a 88 y.o. male with medical history significant for HTN, CKD stage IIIa, HLD,BPH,who has interim history of prostatitis for which he was on bactrim x 3 weeks.  He presented to the hospital because of generalized weakness.  He also reported history of fall about 2 weeks prior to admission, when he rolled out of bed and struck his head.  He bled from his nose and his lip.  However, he did not seek medical attention.  He had been experiencing intermittent headaches since then.   He was found to have AKI on CKD stage IIIa.  He was treated with IV fluids.  AKI was probably from recent use of Bactrim.  He was also found to have erythematous rash with some vesicles on the right upper chest, right shoulder, right side of the back of the neck and right upper back.  Mild erythematous rash noted in the lower abdomen and bilateral lower extremities.  He did not complain of any itching or pain from the rash.  Vesicles developed  on the erythematous rash on the right side of the chest, back and neck.  Because she was localized to the right side (confined to a particular dermatome), herpes zoster was suspected.  Of note, he was not aware of the rash until it was discovered on 03/26/2022.  Patient said he had a history of chickenpox but has not had the zoster vaccine.  He was  started on valacyclovir for presumed herpes zoster because of advanced age.  His condition has improved.  He was able to ambulate with PT and home health therapy was recommended.  He is deemed stable for discharge to home today.  He has been advised to avoid Bactrim.       Consultants: None Procedures performed: None  Disposition: Home Diet recommendation:  Discharge Diet Orders (From admission, onward)     Start     Ordered   03/27/22 0000  Diet - low sodium heart healthy        03/27/22 1222           Cardiac diet DISCHARGE MEDICATION: Allergies as of 03/27/2022   No Known Allergies      Medication List     STOP taking these medications    diclofenac Sodium 1 % Gel Commonly known as: VOLTAREN   sulfamethoxazole-trimethoprim 800-160 MG tablet Commonly known as: BACTRIM DS       TAKE these medications    Calcium Carb-Cholecalciferol 600-10 MG-MCG Caps Take 1 capsule by mouth 2 (two) times daily.   CAPTIVA aspirin 81 MG tablet Chew 1 tablet by mouth daily.   carvedilol 3.125 MG tablet Commonly known as: COREG Take 3.125 mg by mouth 2 (two) times daily.   finasteride 5 MG tablet Commonly known as: PROSCAR Take 5 mg by mouth daily.   hydrochlorothiazide 25 MG tablet  Commonly known as: HYDRODIURIL Take 25 mg by mouth daily.   multivitamin capsule Take 1 capsule by mouth daily.   POTASSIUM PHOSPHATE PO Take 1 tablet by mouth daily.   simvastatin 20 MG tablet Commonly known as: ZOCOR Take 20 mg by mouth at bedtime.   valACYclovir 1000 MG tablet Commonly known as: VALTREX Take 1 tablet (1,000 mg total) by mouth 2 (two) times daily for 7 days.   vitamin B-12 1000 MCG tablet Commonly known as: CYANOCOBALAMIN Take 1 tablet by mouth daily.        Discharge Exam: Filed Weights   03/25/22 0500 03/26/22 0500 03/27/22 0500  Weight: 87.5 kg 86.7 kg 87.4 kg   GEN: NAD SKIN: Erythematous rash with some vesicles on the right upper chest, right  shoulder, right side of the back of the neck and right upper back.  Mild erythematous rash noted in the lower abdomen and bilateral lower extremities EYES: EOMI ENT: MMM CV: RRR PULM: CTA B ABD: soft, ND, NT, +BS CNS: AAO x 3, non focal EXT: No edema or tenderness            Condition at discharge: good  The results of significant diagnostics from this hospitalization (including imaging, microbiology, ancillary and laboratory) are listed below for reference.   Imaging Studies: DG Chest 2 View  Result Date: 03/24/2022 CLINICAL DATA:  Weakness EXAM: CHEST - 2 VIEW COMPARISON:  None available FINDINGS: The cardiomediastinal silhouette is within normal limits. There is no focal airspace consolidation. There is no pleural effusion. No pneumothorax. There is no acute osseous abnormality. Bilateral shoulder degenerative changes. Thoracic spondylosis. IMPRESSION: No evidence of acute cardiopulmonary disease. Electronically Signed   By: Maurine Simmering M.D.   On: 03/24/2022 13:30   CT HEAD WO CONTRAST (5MM)  Result Date: 03/24/2022 CLINICAL DATA:  Fall, possible concussion. EXAM: CT HEAD WITHOUT CONTRAST CT CERVICAL SPINE WITHOUT CONTRAST TECHNIQUE: Multidetector CT imaging of the head and cervical spine was performed following the standard protocol without intravenous contrast. Multiplanar CT image reconstructions of the cervical spine were also generated. RADIATION DOSE REDUCTION: This exam was performed according to the departmental dose-optimization program which includes automated exposure control, adjustment of the mA and/or kV according to patient size and/or use of iterative reconstruction technique. COMPARISON:  CT examination dated October 09, 2021 FINDINGS: CT HEAD FINDINGS Brain: No evidence of acute infarction, hemorrhage, hydrocephalus, extra-axial collection or mass lesion/mass effect. Prominence of the ventricles and sulci secondary to mild cerebral volume loss. Low-attenuation of the  periventricular and subcortical white matter presumed chronic microvascular ischemic changes. Lacunar infarct of the left thalamus, unchanged. Vascular: No hyperdense vessel or unexpected calcification. Skull: Normal. Negative for fracture or focal lesion. Sinuses/Orbits: No acute finding. Mucosal thickening of the maxillary sinuses. Patchy opacification of the right ethmoid air cells. Other: None. CT CERVICAL SPINE FINDINGS Alignment: Reversal of normal cervical lordosis. Skull base and vertebrae: No acute fracture. No primary bone lesion or focal pathologic process. Soft tissues and spinal canal: No prevertebral fluid or swelling. No visible canal hematoma. Disc levels: C2-C3: No significant spinal canal or neural foraminal stenosis. Moderate bilateral facet joint arthropathy, right worse than the left. C3-C4: Disc height loss with mild to moderate spinal canal stenosis. Uncovertebral joint arthropathy and facet joint arthropathy with moderate bilateral neural foraminal stenosis. C4-C5: Disc height loss and disc osteophyte complex with spinal canal stenosis. Uncovertebral joint arthropathy with moderate bilateral neural foraminal stenosis. C5-C6: Disc height loss and uncovertebral joint arthropathy. No significant spinal  canal stenosis. Mild left neural foraminal stenosis. C6-C7: Disc height loss and disc osteophyte complex. Mild spinal canal stenosis. No significant neural foraminal stenosis. C7-T1: Disc osteophyte complex. No significant spinal canal stenosis. Mild bilateral neural foraminal stenosis. Upper chest: Negative. Other: None IMPRESSION: CT head: 1.  No acute intracranial abnormality. 2. 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 3.  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 CT cervical spine: 1.  No acute fracture or traumatic subluxation. 2. Advanced degenerative changes with multilevel  uncovertebral joint and facet joint arthropathy with associated neural foraminal stenosis as detailed above. Electronically Signed   By: Larose Hires D.O.   On: 03/24/2022 11:58   CT Cervical Spine Wo Contrast  Result Date: 03/24/2022 CLINICAL DATA:  Fall, possible concussion. EXAM: CT HEAD WITHOUT CONTRAST CT CERVICAL SPINE WITHOUT CONTRAST TECHNIQUE: Multidetector CT imaging of the head and cervical spine was performed following the standard protocol without intravenous contrast. Multiplanar CT image reconstructions of the cervical spine were also generated. RADIATION DOSE REDUCTION: This exam was performed according to the departmental dose-optimization program which includes automated exposure control, adjustment of the mA and/or kV according to patient size and/or use of iterative reconstruction technique. COMPARISON:  CT examination dated October 09, 2021 FINDINGS: CT HEAD FINDINGS Brain: No evidence of acute infarction, hemorrhage, hydrocephalus, extra-axial collection or mass lesion/mass effect. Prominence of the ventricles and sulci secondary to mild cerebral volume loss. Low-attenuation of the periventricular and subcortical white matter presumed chronic microvascular ischemic changes. Lacunar infarct of the left thalamus, unchanged. Vascular: No hyperdense vessel or unexpected calcification. Skull: Normal. Negative for fracture or focal lesion. Sinuses/Orbits: No acute finding. Mucosal thickening of the maxillary sinuses. Patchy opacification of the right ethmoid air cells. Other: None. CT CERVICAL SPINE FINDINGS Alignment: Reversal of normal cervical lordosis. Skull base and vertebrae: No acute fracture. No primary bone lesion or focal pathologic process. Soft tissues and spinal canal: No prevertebral fluid or swelling. No visible canal hematoma. Disc levels: C2-C3: No significant spinal canal or neural foraminal stenosis. Moderate bilateral facet joint arthropathy, right worse than the left. C3-C4:  Disc height loss with mild to moderate spinal canal stenosis. Uncovertebral joint arthropathy and facet joint arthropathy with moderate bilateral neural foraminal stenosis. C4-C5: Disc height loss and disc osteophyte complex with spinal canal stenosis. Uncovertebral joint arthropathy with moderate bilateral neural foraminal stenosis. C5-C6: Disc height loss and uncovertebral joint arthropathy. No significant spinal canal stenosis. Mild left neural foraminal stenosis. C6-C7: Disc height loss and disc osteophyte complex. Mild spinal canal stenosis. No significant neural foraminal stenosis. C7-T1: Disc osteophyte complex. No significant spinal canal stenosis. Mild bilateral neural foraminal stenosis. Upper chest: Negative. Other: None IMPRESSION: CT head: 1.  No acute intracranial abnormality. 2. 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 3.  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 CT cervical spine: 1.  No acute fracture or traumatic subluxation. 2. Advanced degenerative changes with multilevel uncovertebral joint and facet joint arthropathy with associated neural foraminal stenosis as detailed above. Electronically Signed   By: Larose Hires D.O.   On: 03/24/2022 11:58    Microbiology: No results found for this or any previous visit.  Labs: CBC: Recent Labs  Lab 03/24/22 1120 03/25/22 0539  WBC 4.5 5.0  HGB 12.1* 11.6*  HCT 35.8* 34.0*  MCV 89.3 87.9  PLT 181 176  Basic Metabolic Panel: Recent Labs  Lab 03/24/22 1120 03/25/22 0539 03/26/22 0348 03/27/22 0532  NA 131* 133* 132* 133*  K 4.4 4.7 4.4 4.3  CL 103 105 102 105  CO2 19* 21* 23 23  GLUCOSE 124* 122* 120* 109*  BUN 39* 34* 32* 26*  CREATININE 2.70* 2.11* 1.52* 1.20  CALCIUM 8.9 9.0 8.2* 8.6*  MG 2.2  --   --   --    Liver Function Tests: No results for input(s): "AST", "ALT", "ALKPHOS", "BILITOT", "PROT", "ALBUMIN" in  the last 168 hours. CBG: No results for input(s): "GLUCAP" in the last 168 hours.  Discharge time spent: greater than 30 minutes.  Signed: Lurene Shadow, MD [facility] 03/27/2022

## 2022-03-27 NOTE — Care Management Important Message (Signed)
Important Message  Patient Details  Name: Jose Porter MRN: [id number] Date of Birth: [birth year]   Medicare Important Message Given:  N/A - LOS <3 / Initial given by admissions     Olegario Messier A Ladona Rosten 03/27/2022, 8:58 AM

## 2022-04-19 ENCOUNTER — Emergency Department
Admission: EM | Admit: 2022-04-19 | Discharge: 2022-04-19 | Disposition: A | Discharge status: Home / Self Care | Payer: Medicare Other | Attending: Student in an Organized Health Care Education/Training Program | Admitting: Student in an Organized Health Care Education/Training Program

## 2022-04-19 ENCOUNTER — Other Ambulatory Visit: Payer: Self-pay

## 2022-04-19 ENCOUNTER — Encounter: Payer: Self-pay | Admitting: Emergency Medicine

## 2022-04-19 DIAGNOSIS — I1310 Hypertensive heart and chronic kidney disease without heart failure, with stage 1 through stage 4 chronic kidney disease, or unspecified chronic kidney disease: Secondary | ICD-10-CM | POA: Diagnosis not present

## 2022-04-19 DIAGNOSIS — N183 Chronic kidney disease, stage 3 unspecified: Secondary | ICD-10-CM | POA: Diagnosis not present

## 2022-04-19 DIAGNOSIS — H7292 Unspecified perforation of tympanic membrane, left ear: Secondary | ICD-10-CM | POA: Insufficient documentation

## 2022-04-19 DIAGNOSIS — H9202 Otalgia, left ear: Secondary | ICD-10-CM | POA: Insufficient documentation

## 2022-04-19 MED ORDER — CIPROFLOXACIN-DEXAMETHASONE 0.3-0.1 % OT SUSP: 4.0000 [drp] | Freq: Two times a day (BID) | OTIC | 0 refills | Status: DC

## 2022-04-19 MED ORDER — AMOXICILLIN 500 MG PO TABS
500.0000 mg | ORAL_TABLET | Freq: Three times a day (TID) | ORAL | 0 refills | Status: DC
Start: 2022-04-19 — End: 2023-01-23

## 2022-04-19 NOTE — ED Notes (Signed)
Pt reports woke up this am around 0400 and his left ear was draining some liquid and painful. Pt states has a punctured ear drum anyway so thought it may be getting infected. Pt reports last MD visit with ENT he was supposed to get a tube but his MD told him it was ruptured and he could not get the tube. Pt reports thinks it has stayed punctured. Pt reports wears hearing aids but does not have it in that ear.

## 2022-04-19 NOTE — ED Provider Notes (Signed)
[facility] Provider Note    Event Date/Time   First MD Initiated Contact with Patient 04/19/22 [phone]     (approximate)   History   Otalgia   HPI  Jose Porter is a 88 y.o. male  with history of hypertension, stage 3 renal failure presents to the ER for evaluation of left ear pain and clear drainage. Symptoms started this morning. 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 He denies recent injury or use of q-tips. Hearing is decreased at baseline, but seems more decreased today. No recent acute illness. No fever today.   Past Medical History:  Diagnosis Date   Hypertension      Physical Exam   Triage Vital Signs: ED Triage Vitals  Enc Vitals Group     BP 04/19/22 0704 (!) 159/93     Pulse Rate 04/19/22 0704 81     Resp 04/19/22 0704 18     Temp 04/19/22 0704 97.7 F (36.5 C)     Temp Source 04/19/22 0704 Oral     SpO2 04/19/22 0704 95 %     Weight 04/19/22 0710 187 lb (84.8 kg)     Height 04/19/22 0710 5\' 10"  (1.778 m)     Head Circumference --      Peak Flow --      Pain Score 04/19/22 0710 1     Pain Loc --      Pain Edu? --      Excl. in GC? --     Most recent vital signs: Vitals:   04/19/22 0704 04/19/22 0714  BP: (!) 159/93 (!) 149/93  Pulse: 81 77  Resp: 18 18  Temp: 97.7 F (36.5 C) 97.8 F (36.6 C)  SpO2: 95% 94%    General: Awake, no distress.  CV:  Good peripheral perfusion.  Resp:  Normal effort.  Abd:  No distention.  Other:  Right TM occluded by cerumen. Yellow tinged thin drainage in EAC.  Left TM with noted scarring, erythema, and very small rupture at about 6 o'clock position.    ED Results / Procedures / Treatments   Labs (all labs ordered are listed, but only abnormal results are displayed) Labs Reviewed - No data to display   EKG   RADIOLOGY  Not indicated.  I have independently reviewed and interpreted imaging as well as reviewed report from  radiology.  PROCEDURES:  Critical Care performed: No  Procedures   MEDICATIONS ORDERED IN ED:  Medications - No data to display   IMPRESSION / MDM / ASSESSMENT AND PLAN / ED COURSE   I reviewed the triage vital signs and the nursing notes.  Differential diagnosis includes, but is not limited to: Otitis media; otorrhea; tympanic membrane rupture  Patient's presentation is most consistent with acute, uncomplicated illness.  88 year old male presents to the emergency department for treatment and evaluation of the left earache with watery discharge that started this morning.  See HPI for further details.  On exam, he does have a very small tympanic membrane perforation.  Will treat with amoxicillin and Ciprodex drops and have him follow-up with his ENT specialist at [facility].  He was advised against getting any water in the ear until reevaluated.  He was encouraged to return to the emergency department for symptoms of change or worsen if he is unable to schedule an appointment.     FINAL CLINICAL IMPRESSION(S) / ED DIAGNOSES   Final diagnoses:  Otalgia of left ear  Tympanic membrane rupture, left     Rx / DC Orders   ED Discharge Orders          Ordered    amoxicillin (AMOXIL) 500 MG tablet  3 times daily        04/19/22 0739    ciprofloxacin-dexamethasone (CIPRODEX) OTIC suspension  2 times daily        04/19/22 7619             Note:  This document was prepared using Dragon voice recognition software and may include unintentional dictation errors.   Chinita Pester, FNP 04/19/22 0751    Willy Eddy, MD 04/19/22 [phone]

## 2022-04-19 NOTE — ED Notes (Signed)
Pt verbalizes understanding of d/c instructions, medications and follow up 

## 2022-04-19 NOTE — ED Triage Notes (Signed)
Pt via POV from home. Pt c/o L ear pain and drainage stated it started this AM. Pt states drainage is just water and pain is just a soreness. States that he does have issues with that L ear and has a hearing aid. Pt is A&Ox4 and NAD

## 2022-07-27 ENCOUNTER — Emergency Department
Admission: EM | Admit: 2022-07-27 | Discharge: 2022-07-27 | Disposition: A | Discharge status: Home / Self Care | Payer: Medicare Other | Attending: Emergency Medicine | Admitting: Emergency Medicine

## 2022-07-27 ENCOUNTER — Other Ambulatory Visit: Payer: Self-pay

## 2022-07-27 ENCOUNTER — Emergency Department: Payer: Medicare Other

## 2022-07-27 DIAGNOSIS — Y92002 Bathroom of unspecified non-institutional (private) residence single-family (private) house as the place of occurrence of the external cause: Secondary | ICD-10-CM | POA: Insufficient documentation

## 2022-07-27 DIAGNOSIS — N189 Chronic kidney disease, unspecified: Secondary | ICD-10-CM | POA: Insufficient documentation

## 2022-07-27 DIAGNOSIS — W01198A Fall on same level from slipping, tripping and stumbling with subsequent striking against other object, initial encounter: Secondary | ICD-10-CM | POA: Diagnosis not present

## 2022-07-27 DIAGNOSIS — I129 Hypertensive chronic kidney disease with stage 1 through stage 4 chronic kidney disease, or unspecified chronic kidney disease: Secondary | ICD-10-CM | POA: Insufficient documentation

## 2022-07-27 DIAGNOSIS — S0990XA Unspecified injury of head, initial encounter: Secondary | ICD-10-CM

## 2022-07-27 DIAGNOSIS — S0001XA Abrasion of scalp, initial encounter: Secondary | ICD-10-CM

## 2022-07-27 MED ORDER — BACITRACIN ZINC 500 UNIT/GM EX OINT
TOPICAL_OINTMENT | Freq: Once | CUTANEOUS | Status: AC
  Filled 2022-07-27: qty 0.9

## 2022-07-27 NOTE — ED Triage Notes (Addendum)
Pt here visiting wife who is admitted in the hospital. Pt was attempting to clean bathroom floor in wife's room when he had mechanical fall and hit head - small abrasion to posterior head. Denies blood thinner use. Pt reports hospital staff insisted pt come check in to be evaluated due to abrasion on head bleeding. Denies dizziness, blurred vision, chest pain, and weakness at this time. Alert and oriented at this time.

## 2022-07-27 NOTE — Discharge Instructions (Signed)
Your exam, CT scans are normal and reassuring at this time.  No signs of a serious head injury or spinal cord injury.  You have a small abrasion to the scalp which should be cleansed and covered with antibiotic ointment.  Take over-the-counter Tylenol as needed.  Follow-up with your primary provider return to the ED if needed.

## 2022-07-27 NOTE — ED Provider Notes (Signed)
[facility] Emergency Department Provider Note     Event Date/Time   First MD Initiated Contact with Patient 07/27/22 2140     (approximate)   History   Fall   HPI  Jose Porter is a 88 y.o. male history of CKD, hypertension, and hyponatremia, presents to the ED from an inpatient mental status.  Patient has been presenting with his wife was admitted to another units.  Patient was apparently typically in the bathroom in his wife's room when he slipped and fell, hitting his head on a door jam.  He denies any LOC, nausea, vomiting, or presyncope.  He does present with a small abrasion to the posterior scalp.  He denies any blood thinner use.  Patient was advised and encouraged by the hospital staff to come to the ED to be evaluated.  He presents to the ED without complaint, denies any chest pain, weakness, blurry vision, or other injuries at this time.     Physical Exam   Triage Vital Signs: ED Triage Vitals  Enc Vitals Group     BP 07/27/22 1952 136/72     Pulse Rate 07/27/22 1952 80     Resp 07/27/22 1952 17     Temp 07/27/22 1952 97.7 F (36.5 C)     Temp Source 07/27/22 1952 Oral     SpO2 07/27/22 1952 95 %     Weight 07/27/22 1953 183 lb (83 kg)     Height 07/27/22 1953 6' (1.829 m)     Head Circumference --      Peak Flow --      Pain Score 07/27/22 1953 0     Pain Loc --      Pain Edu? --      Excl. in GC? --     Most recent vital signs: Vitals:   07/27/22 1952  BP: 136/72  Pulse: 80  Resp: 17  Temp: 97.7 F (36.5 C)  SpO2: 95%    General Awake, no distress.  A&O x4 HEENT NCAT except for superficial abrasions to posterior crown.Marland Kitchen PERRL. EOMI. No rhinorrhea. Mucous membranes are moist.  CV:  Good peripheral perfusion.  RESP:  Normal effort.  ABD:  No distention.    ED Results / Procedures / Treatments   Labs (all labs ordered are listed, but only abnormal results are displayed) Labs Reviewed - No data to  display   EKG  RADIOLOGY  I personally viewed and evaluated these images as part of my medical decision making, as well as reviewing the written report by the radiologist.  ED Provider Interpretation: No acute findings  CT Cervical Spine Wo Contrast  Result Date: 07/27/2022 CLINICAL DATA:  Status post fall. EXAM: CT CERVICAL SPINE WITHOUT CONTRAST TECHNIQUE: Multidetector CT imaging of the cervical spine was performed without intravenous contrast. Multiplanar CT image reconstructions were also generated. RADIATION DOSE REDUCTION: This exam was performed according to the departmental dose-optimization program which includes automated exposure control, adjustment of the mA and/or kV according to patient size and/or use of iterative reconstruction technique. COMPARISON:  March 24, 2022 FINDINGS: Alignment: There is reversal of the normal cervical spine lordosis. Skull base and vertebrae: No acute fracture. No primary bone lesion or focal pathologic process. Soft tissues and spinal canal: No prevertebral fluid or swelling. No visible canal hematoma. Disc levels: Marked severity endplate sclerosis, moderate to marked severity anterior osteophyte formation and moderate severity posterior bony spurring are seen at the levels of C3-C4, C4-C5, C5-C6,  C6-C7 and C7-T1. Marked severity intervertebral disc space narrowing is seen at the levels of C3-C4, C4-C5, C5-C6, C6-C7 and C7-T1. Bilateral marked severity multilevel facet joint hypertrophy is noted. Upper chest: There is mild, chronic anterior displacement of the right sternoclavicular joint. Other: None. IMPRESSION: 1. No acute fracture or subluxation in the cervical spine. 2. Marked severity multilevel degenerative changes. Electronically Signed   By: Aram Candela M.D.   On: 07/27/2022 22:26   CT Head Wo Contrast  Result Date: 07/27/2022 CLINICAL DATA:  Status post fall. EXAM: CT HEAD WITHOUT CONTRAST TECHNIQUE: Contiguous axial images were obtained  from the base of the skull through the vertex without intravenous contrast. RADIATION DOSE REDUCTION: This exam was performed according to the departmental dose-optimization program which includes automated exposure control, adjustment of the mA and/or kV according to patient size and/or use of iterative reconstruction technique. COMPARISON:  March 24, 2022 FINDINGS: Brain: There is mild cerebral atrophy with widening of the extra-axial spaces and ventricular dilatation. There are areas of decreased attenuation within the white matter tracts of the supratentorial brain, consistent with microvascular disease changes. Small, chronic bilateral basal ganglia lacunar infarcts are noted. Vascular: There is calcification of the bilateral cavernous carotid arteries. Skull: A chronic fracture deformity is seen along the tip of the nasal bone. Sinuses/Orbits: There is stable marked severity right ethmoid sinus, right-sided frontal sinus and anterior right-sided nasal mucosal thickening. Other: There is a stable 2.6 cm x 0.8 cm left frontoparietal scalp lipoma. IMPRESSION: 1. No acute intracranial abnormality. 2. Stable marked severity right ethmoid sinus, right-sided frontal sinus and anterior right-sided nasal mucosal thickening. Electronically Signed   By: Aram Candela M.D.   On: 07/27/2022 22:20     PROCEDURES:  Critical Care performed: No  Procedures   MEDICATIONS ORDERED IN ED: Medications  bacitracin ointment ( Topical Given 07/27/22 2311)     IMPRESSION / MDM / ASSESSMENT AND PLAN / ED COURSE  I reviewed the triage vital signs and the nursing notes.                              Differential diagnosis includes, but is not limited to, intracranial hemorrhage, previous head trauma, cavernous venous thrombosis, tension headache, temporal arteritis, migraine or migraine equivalent, idiopathic intracranial hypertension, and non-specific headache.  Patient's presentation is most consistent with acute  presentation with potential threat to life or bodily function.  Geriatric patient to the ED for evaluation of injury sustained following mechanical fall with a minor head injury.  Patient is evaluated for his complaints in ED, found have reassuring work-up.  Patient is evaluated with CT imaging which reveals no acute intracranial process or cervical spine injury, based on my review of images.  Patient's diagnosis is consistent with either head injury with scalp abrasion. Patient will be discharged home with patient to take OTC Tylenol as needed. Patient is to follow up with his primary provider as needed or otherwise directed. Patient is given ED precautions to return to the ED for any worsening or new symptoms.  FINAL CLINICAL IMPRESSION(S) / ED DIAGNOSES   Final diagnoses:  Minor head injury, initial encounter  Abrasion of scalp, initial encounter     Rx / DC Orders   ED Discharge Orders     None        Note:  This document was prepared using Dragon voice recognition software and may include unintentional dictation errors.    Gibson Lad,  Dannielle Karvonen, PA-C 07/27/22 2348    Arta Silence, MD 07/28/22 [phone]

## 2023-01-20 ENCOUNTER — Other Ambulatory Visit: Payer: Self-pay

## 2023-01-20 ENCOUNTER — Emergency Department: Payer: Medicare Other

## 2023-01-20 ENCOUNTER — Inpatient Hospital Stay
Admission: EM | Admit: 2023-01-20 | Discharge: 2023-01-23 | DRG: 202 | Disposition: A | Discharge status: Home / Self Care | Payer: Medicare Other | Attending: Internal Medicine | Admitting: Internal Medicine

## 2023-01-20 ENCOUNTER — Encounter: Payer: Self-pay | Admitting: Emergency Medicine

## 2023-01-20 DIAGNOSIS — J9811 Atelectasis: Secondary | ICD-10-CM | POA: Diagnosis present

## 2023-01-20 DIAGNOSIS — Z888 Allergy status to other drugs, medicaments and biological substances status: Secondary | ICD-10-CM

## 2023-01-20 DIAGNOSIS — E871 Hypo-osmolality and hyponatremia: Secondary | ICD-10-CM | POA: Diagnosis present

## 2023-01-20 DIAGNOSIS — J209 Acute bronchitis, unspecified: Principal | ICD-10-CM | POA: Diagnosis present

## 2023-01-20 DIAGNOSIS — R0902 Hypoxemia: Principal | ICD-10-CM | POA: Diagnosis present

## 2023-01-20 DIAGNOSIS — J189 Pneumonia, unspecified organism: Secondary | ICD-10-CM

## 2023-01-20 DIAGNOSIS — K449 Diaphragmatic hernia without obstruction or gangrene: Secondary | ICD-10-CM | POA: Diagnosis present

## 2023-01-20 DIAGNOSIS — I700 Atherosclerosis of aorta: Secondary | ICD-10-CM | POA: Diagnosis present

## 2023-01-20 DIAGNOSIS — E785 Hyperlipidemia, unspecified: Secondary | ICD-10-CM | POA: Diagnosis present

## 2023-01-20 DIAGNOSIS — Z79899 Other long term (current) drug therapy: Secondary | ICD-10-CM

## 2023-01-20 DIAGNOSIS — E213 Hyperparathyroidism, unspecified: Secondary | ICD-10-CM | POA: Diagnosis present

## 2023-01-20 DIAGNOSIS — E860 Dehydration: Secondary | ICD-10-CM | POA: Diagnosis present

## 2023-01-20 DIAGNOSIS — I5A Non-ischemic myocardial injury (non-traumatic): Secondary | ICD-10-CM | POA: Diagnosis present

## 2023-01-20 DIAGNOSIS — I1310 Hypertensive heart and chronic kidney disease without heart failure, with stage 1 through stage 4 chronic kidney disease, or unspecified chronic kidney disease: Secondary | ICD-10-CM | POA: Diagnosis present

## 2023-01-20 DIAGNOSIS — I2510 Atherosclerotic heart disease of native coronary artery without angina pectoris: Secondary | ICD-10-CM | POA: Diagnosis present

## 2023-01-20 DIAGNOSIS — Y92009 Unspecified place in unspecified non-institutional (private) residence as the place of occurrence of the external cause: Secondary | ICD-10-CM

## 2023-01-20 DIAGNOSIS — J40 Bronchitis, not specified as acute or chronic: Secondary | ICD-10-CM | POA: Diagnosis present

## 2023-01-20 DIAGNOSIS — T502X5A Adverse effect of carbonic-anhydrase inhibitors, benzothiadiazides and other diuretics, initial encounter: Secondary | ICD-10-CM | POA: Diagnosis present

## 2023-01-20 DIAGNOSIS — Z8616 Personal history of COVID-19: Secondary | ICD-10-CM

## 2023-01-20 DIAGNOSIS — N1831 Chronic kidney disease, stage 3a: Secondary | ICD-10-CM | POA: Diagnosis present

## 2023-01-20 DIAGNOSIS — I10 Essential (primary) hypertension: Secondary | ICD-10-CM | POA: Diagnosis present

## 2023-01-20 LAB — BASIC METABOLIC PANEL
Anion gap: 10 (ref 5–15)
BUN: 24 mg/dL — ABNORMAL HIGH (ref 8–23)
CO2: 25 mmol/L (ref 22–32)
Calcium: 8.7 mg/dL — ABNORMAL LOW (ref 8.9–10.3)
Chloride: 96 mmol/L — ABNORMAL LOW (ref 98–111)
Creatinine, Ser: 1.41 mg/dL — ABNORMAL HIGH (ref 0.61–1.24)
GFR, Estimated: 47 mL/min — ABNORMAL LOW (ref >60)
Glucose, Bld: 160 mg/dL — ABNORMAL HIGH (ref 70–99)
Potassium: 3.8 mmol/L (ref 3.5–5.1)
Sodium: 131 mmol/L — ABNORMAL LOW (ref 135–145)

## 2023-01-20 LAB — CBC
HCT: 40.2 % (ref 39.0–52.0)
Hemoglobin: 13.3 g/dL (ref 13.0–17.0)
MCH: 30.3 pg (ref 26.0–34.0)
MCHC: 33.1 g/dL (ref 30.0–36.0)
MCV: 91.6 fL (ref 80.0–100.0)
Platelets: 196 10*3/uL (ref 150–400)
RBC: 4.39 MIL/uL (ref 4.22–5.81)
RDW: 13.9 % (ref 11.5–15.5)
WBC: 10.6 10*3/uL — ABNORMAL HIGH (ref 4.0–10.5)
nRBC: 0 % (ref 0.0–0.2)

## 2023-01-20 LAB — SARS CORONAVIRUS 2 BY RT PCR: SARS Coronavirus 2 by RT PCR: NEGATIVE

## 2023-01-20 LAB — TROPONIN I (HIGH SENSITIVITY)
Troponin I (High Sensitivity): 33 ng/L — ABNORMAL HIGH (ref <18)
Troponin I (High Sensitivity): 31 ng/L — ABNORMAL HIGH (ref <18)

## 2023-01-20 NOTE — ED Notes (Signed)
Blue top sent to lab with save label 

## 2023-01-20 NOTE — ED Triage Notes (Signed)
Pt arrived via POV with reports of weakness, cough x 7 days, pt reports shortness of breath, nasal congestion. Pt reports poor sleep due to coughing at night.  Pt denies any fevers. Cough has been non-productive per pt.

## 2023-01-21 ENCOUNTER — Emergency Department: Payer: Medicare Other

## 2023-01-21 ENCOUNTER — Encounter: Payer: Self-pay | Admitting: Internal Medicine

## 2023-01-21 DIAGNOSIS — N1831 Chronic kidney disease, stage 3a: Secondary | ICD-10-CM | POA: Diagnosis present

## 2023-01-21 DIAGNOSIS — I5A Non-ischemic myocardial injury (non-traumatic): Secondary | ICD-10-CM | POA: Diagnosis present

## 2023-01-21 DIAGNOSIS — E871 Hypo-osmolality and hyponatremia: Secondary | ICD-10-CM

## 2023-01-21 DIAGNOSIS — J209 Acute bronchitis, unspecified: Secondary | ICD-10-CM | POA: Diagnosis not present

## 2023-01-21 DIAGNOSIS — E785 Hyperlipidemia, unspecified: Secondary | ICD-10-CM | POA: Diagnosis present

## 2023-01-21 DIAGNOSIS — I10 Essential (primary) hypertension: Secondary | ICD-10-CM | POA: Diagnosis not present

## 2023-01-21 LAB — URINALYSIS, ROUTINE W REFLEX MICROSCOPIC
Bilirubin Urine: NEGATIVE
Glucose, UA: NEGATIVE mg/dL
Hgb urine dipstick: NEGATIVE
Ketones, ur: NEGATIVE mg/dL
Leukocytes,Ua: NEGATIVE
Nitrite: NEGATIVE
Protein, ur: NEGATIVE mg/dL
Specific Gravity, Urine: 1.021 (ref 1.005–1.030)
pH: 6 (ref 5.0–8.0)

## 2023-01-21 LAB — PROCALCITONIN: Procalcitonin: <0.1 ng/mL

## 2023-01-21 LAB — LACTIC ACID, PLASMA: Lactic Acid, Venous: 0.8 mmol/L (ref 0.5–1.9)

## 2023-01-21 LAB — BRAIN NATRIURETIC PEPTIDE: B Natriuretic Peptide: 111 pg/mL — ABNORMAL HIGH (ref 0.0–100.0)

## 2023-01-21 MED ORDER — FINASTERIDE 5 MG PO TABS
5.0000 mg | ORAL_TABLET | Freq: Every day | ORAL | Status: DC
  Administered 2023-01-21 – 2023-01-23 (×3): 5 mg via ORAL
  Filled 2023-01-21 (×3): qty 1

## 2023-01-21 MED ORDER — STUDY - CAPTIVA - ASPIRIN 81 MG TABLET (PI-SETHI): 1.0000 | CHEWABLE_TABLET | Freq: Every day | ORAL | Status: DC

## 2023-01-21 MED ORDER — SODIUM CHLORIDE 0.9 % IV SOLN
500.0000 mg | INTRAVENOUS | Status: DC
  Administered 2023-01-22: 500 mg via INTRAVENOUS
  Filled 2023-01-21: qty 500

## 2023-01-21 MED ORDER — SODIUM CHLORIDE 0.9 % IV SOLN
1.0000 g | INTRAVENOUS | Status: DC
  Administered 2023-01-22 – 2023-01-23 (×2): 1 g via INTRAVENOUS
  Filled 2023-01-21 (×2): qty 1

## 2023-01-21 MED ORDER — VITAMIN B-12 1000 MCG PO TABS
1000.0000 ug | ORAL_TABLET | Freq: Every day | ORAL | Status: DC
  Administered 2023-01-21 – 2023-01-23 (×3): 1000 ug via ORAL
  Filled 2023-01-21 (×3): qty 1

## 2023-01-21 MED ORDER — SODIUM CHLORIDE 0.9 % IV SOLN
500.0000 mg | Freq: Once | INTRAVENOUS | Status: AC
  Administered 2023-01-21: 500 mg via INTRAVENOUS
  Filled 2023-01-21: qty 5

## 2023-01-21 MED ORDER — IOHEXOL 350 MG/ML SOLN
75.0000 mL | Freq: Once | INTRAVENOUS | Status: AC | PRN
  Administered 2023-01-21: 75 mL via INTRAVENOUS

## 2023-01-21 MED ORDER — ALBUTEROL SULFATE (2.5 MG/3ML) 0.083% IN NEBU: 3.0000 mL | INHALATION_SOLUTION | RESPIRATORY_TRACT | Status: DC | PRN

## 2023-01-21 MED ORDER — ENOXAPARIN SODIUM 40 MG/0.4ML IJ SOSY
40.0000 mg | PREFILLED_SYRINGE | INTRAMUSCULAR | Status: DC
  Administered 2023-01-21 – 2023-01-23 (×3): 40 mg via SUBCUTANEOUS
  Filled 2023-01-21 (×3): qty 0.4

## 2023-01-21 MED ORDER — ASPIRIN 81 MG PO TBEC
81.0000 mg | DELAYED_RELEASE_TABLET | Freq: Every day | ORAL | Status: DC
  Administered 2023-01-21 – 2023-01-23 (×3): 81 mg via ORAL
  Filled 2023-01-21 (×3): qty 1

## 2023-01-21 MED ORDER — ACETAMINOPHEN 325 MG PO TABS: 650.0000 mg | ORAL_TABLET | Freq: Four times a day (QID) | ORAL | Status: DC | PRN

## 2023-01-21 MED ORDER — SIMVASTATIN 20 MG PO TABS
20.0000 mg | ORAL_TABLET | Freq: Every day | ORAL | Status: DC
  Administered 2023-01-21 – 2023-01-22 (×2): 20 mg via ORAL
  Filled 2023-01-21 (×2): qty 1

## 2023-01-21 MED ORDER — CARVEDILOL 6.25 MG PO TABS
3.1250 mg | ORAL_TABLET | Freq: Two times a day (BID) | ORAL | Status: DC
  Administered 2023-01-21 – 2023-01-23 (×5): 3.125 mg via ORAL
  Filled 2023-01-21 (×5): qty 1

## 2023-01-21 MED ORDER — SODIUM CHLORIDE 0.9 % IV SOLN
1.0000 g | Freq: Once | INTRAVENOUS | Status: AC
  Administered 2023-01-21: 1 g via INTRAVENOUS
  Filled 2023-01-21: qty 10

## 2023-01-21 MED ORDER — OYSTER SHELL CALCIUM/D3 500-5 MG-MCG PO TABS
ORAL_TABLET | Freq: Two times a day (BID) | ORAL | Status: DC
  Administered 2023-01-21 – 2023-01-23 (×5): 1 via ORAL
  Filled 2023-01-21 (×5): qty 1

## 2023-01-21 MED ORDER — ADULT MULTIVITAMIN W/MINERALS CH
1.0000 | ORAL_TABLET | Freq: Every day | ORAL | Status: DC
  Administered 2023-01-21 – 2023-01-23 (×3): 1 via ORAL
  Filled 2023-01-21 (×3): qty 1

## 2023-01-21 MED ORDER — ONDANSETRON HCL 4 MG/2ML IJ SOLN: 4.0000 mg | Freq: Three times a day (TID) | INTRAMUSCULAR | Status: DC | PRN

## 2023-01-21 MED ORDER — HYDRALAZINE HCL 20 MG/ML IJ SOLN: 5.0000 mg | INTRAMUSCULAR | Status: DC | PRN

## 2023-01-21 MED ORDER — DM-GUAIFENESIN ER 30-600 MG PO TB12
1.0000 | ORAL_TABLET | Freq: Two times a day (BID) | ORAL | Status: DC | PRN
  Administered 2023-01-22 – 2023-01-23 (×2): 1 via ORAL
  Filled 2023-01-21 (×2): qty 1

## 2023-01-21 NOTE — Plan of Care (Signed)

## 2023-01-21 NOTE — ED Provider Notes (Signed)
[facility] Provider Note    Event Date/Time   First MD Initiated Contact with Patient 01/20/23 2357     (approximate)   History   Cough and Weakness   HPI  Jose Porter is a 89 y.o. male who presents to the ED for evaluation of Cough and Weakness   I reviewed PCP visit from January.  History of HTN, HLD, hyperparathyroidism  Pt presents to the ED with family for evaluation of worsening weakness and cough. He reports having a "cold" for the past week or so, symptoms worsening in the past day with increased weakness and cough. Somewhat productive now where it wasn't before. No chest pain.    Physical Exam   Triage Vital Signs: ED Triage Vitals  Enc Vitals Group     BP 01/20/23 2001 132/73     Pulse Rate 01/20/23 2001 94     Resp 01/20/23 2001 (!) 24     Temp 01/20/23 2001 98.3 F (36.8 C)     Temp Source 01/20/23 2252 Oral     SpO2 01/20/23 2001 94 %     Weight 01/20/23 2002 180 lb (81.6 kg)     Height 01/20/23 2002 6' (1.829 m)     Head Circumference --      Peak Flow --      Pain Score 01/20/23 2002 0     Pain Loc --      Pain Edu? --      Excl. in GC? --     Most recent vital signs: Vitals:   01/21/23 0625 01/21/23 0630  BP:  130/68  Pulse:  80  Resp:  (!) 25  Temp:    SpO2: (!) 88% 91%    General: Awake, no distress.  CV:  Good peripheral perfusion.  Resp:  Normal effort.  Mild tachypnea Abd:  No distention.  MSK:  No deformity noted.  Neuro:  No focal deficits appreciated. Other:     ED Results / Procedures / Treatments   Labs (all labs ordered are listed, but only abnormal results are displayed) Labs Reviewed  BASIC METABOLIC PANEL - Abnormal; Notable for the following components:      Result Value   Sodium 131 (*)    Chloride 96 (*)    Glucose, Bld 160 (*)    BUN 24 (*)    Creatinine, Ser 1.41 (*)    Calcium 8.7 (*)    GFR, Estimated 47 (*)    All other components within normal limits  CBC - Abnormal;  Notable for the following components:   WBC 10.6 (*)    All other components within normal limits  URINALYSIS, ROUTINE W REFLEX MICROSCOPIC - Abnormal; Notable for the following components:   Color, Urine STRAW (*)    APPearance CLEAR (*)    All other components within normal limits  BRAIN NATRIURETIC PEPTIDE - Abnormal; Notable for the following components:   B Natriuretic Peptide 111.0 (*)    All other components within normal limits  TROPONIN I (HIGH SENSITIVITY) - Abnormal; Notable for the following components:   Troponin I (High Sensitivity) 33 (*)    All other components within normal limits  TROPONIN I (HIGH SENSITIVITY) - Abnormal; Notable for the following components:   Troponin I (High Sensitivity) 31 (*)    All other components within normal limits  SARS CORONAVIRUS 2 BY RT PCR    EKG Sinus rhythm with a rate of 93 bpm.  Leftward axis and right  bundle.  No STEMI.  RADIOLOGY CT chest interpreted me with left lingular opacity.  No PE.  Official radiology report(s): CT Angio Chest PE W and/or Wo Contrast  Result Date: 01/21/2023 CLINICAL DATA:  Shortness of breath and coughing. Weakness and dyspnea. EXAM: CT ANGIOGRAPHY CHEST WITH CONTRAST TECHNIQUE: Multidetector CT imaging of the chest was performed using the standard protocol during bolus administration of intravenous contrast. Multiplanar CT image reconstructions and MIPs were obtained to evaluate the vascular anatomy. RADIATION DOSE REDUCTION: This exam was performed according to the departmental dose-optimization program which includes automated exposure control, adjustment of the mA and/or kV according to patient size and/or use of iterative reconstruction technique. CONTRAST:  75mL OMNIPAQUE IOHEXOL 350 MG/ML SOLN COMPARISON:  AP chest portable today, PA and lateral chest 03/24/2022 FINDINGS: Cardiovascular: The pulmonary arteries are normal in caliber and do not show embolic filling defects in the large vessels. The small  subsegmental peripheral arteries are unopacified and not evaluated. There is mild cardiomegaly with a left chamber predominance. There is prominence of the superior pulmonary veins. There are calcifications in the aorta and great vessels but no aneurysm, stenosis or dissection. There is mild tortuosity in the descending aorta. There are two-vessel coronary artery calcific plaques in the LAD and right coronary artery, scattered calcification in the mitral annulus and aortic valve leaflets. There is no pericardial effusion. Mediastinum/Nodes: No enlarged mediastinal, hilar, or axillary lymph nodes. Thyroid gland, trachea, and esophagus demonstrate no significant findings. Small hiatal hernia. Lungs/Pleura: No pleural effusion, thickening or pneumothorax. There is moderate elevation of the right diaphragm which could be due to eventration or paresis, with overlying atelectasis in the right middle and lower lobes. There are scattered calcified granulomas. There is diffuse bronchial thickening. There is a small consolidation or atelectasis in the lingular base. Linear pleural calcifications are present in the medial apices. Rest of both lungs are generally clear. There is no visible bronchial plugging. Upper Abdomen: Colonic interposition of the hepatic flexure beneath the elevated right diaphragm. No acute abnormal findings. Musculoskeletal: Asymmetric DJD right shoulder. Osteopenia mild degenerative change of the spine. The ribcage is intact. Review of the MIP images confirms the above findings. IMPRESSION: 1. No large-vessel arterial embolus. The small peripheral subsegmental arteries are unopacified and not evaluated. 2. Cardiomegaly with prominence of the superior pulmonary veins. No pulmonary edema. 3. Aortic and coronary artery atherosclerosis. 4. Moderate elevation of the right diaphragm which could be due to eventration or paresis, with overlying atelectasis in the right middle and lower lobes. 5. Small  consolidation or atelectasis in the lingular base. 6. Diffuse bronchial thickening consistent with bronchitis or reactive airways disease. 7. Small hiatal hernia. Aortic Atherosclerosis (ICD10-I70.0). Electronically Signed   By: Almira Bar M.D.   On: 01/21/2023 02:53   DG Chest Port 1 View  Result Date: 01/20/2023 CLINICAL DATA:  Shortness of breath and cough EXAM: PORTABLE CHEST 1 VIEW COMPARISON:  03/24/2022 FINDINGS: Cardiac shadow is stable. The lungs are well aerated bilaterally. Elevation the right hemidiaphragm is noted. Mild right basilar atelectasis is seen. No effusion is noted. No pneumothorax is noted. IMPRESSION: Mild right basilar atelectasis. Electronically Signed   By: Alcide Clever M.D.   On: 01/20/2023 20:37    PROCEDURES and INTERVENTIONS:  .1-3 Lead EKG Interpretation  Performed by: Delton Prairie, MD Authorized by: Delton Prairie, MD     Interpretation: normal     ECG rate:  80   ECG rate assessment: normal     Rhythm: sinus  rhythm     Ectopy: none     Conduction: normal   .Critical Care  Performed by: Delton Prairie, MD Authorized by: Delton Prairie, MD   Critical care provider statement:    Critical care time (minutes):  30   Critical care time was exclusive of:  Separately billable procedures and treating other patients   Critical care was necessary to treat or prevent imminent or life-threatening deterioration of the following conditions:  Respiratory failure   Critical care was time spent personally by me on the following activities:  Development of treatment plan with patient or surrogate, discussions with consultants, evaluation of patient's response to treatment, examination of patient, ordering and review of laboratory studies, ordering and review of radiographic studies, ordering and performing treatments and interventions, pulse oximetry, re-evaluation of patient's condition and review of old charts   Medications  iohexol (OMNIPAQUE) 350 MG/ML injection 75 mL  (75 mLs Intravenous Contrast Given 01/21/23 0217)  cefTRIAXone (ROCEPHIN) 1 g in sodium chloride 0.9 % 100 mL IVPB (0 g Intravenous Stopped 01/21/23 0443)  azithromycin (ZITHROMAX) 500 mg in sodium chloride 0.9 % 250 mL IVPB (0 mg Intravenous Stopped 01/21/23 0557)     IMPRESSION / MDM / ASSESSMENT AND PLAN / ED COURSE  I reviewed the triage vital signs and the nursing notes.  Differential diagnosis includes, but is not limited to, PE, pneumonia, deconditioning, viral syndrome, sepsis  {Patient presents with symptoms of an acute illness or injury that is potentially life-threatening.  Pleasant 89 year old male who lives at home presents with increasing weakness associated with a cough, evidence of commune acquired pneumonia causing hypoxia requiring medical admission.  He will systemically well without any focal deficits.  Blood work with CKD around baseline.  Marginal leukocytosis does not meet SIRS criteria.  Troponins are flat and slightly elevated, likely due to his CKD and he denies any chest discomfort.  No ischemic features on EKG.  Urine without infectious features.  CXR questions pneumonia and follow-up CTA chest has evidence of a lingular opacity.  Coverage is provided and with ambulation he does have some mild hypoxia and with his weakness I believe he would benefit from medical admission.  He is agreeable.  We will consult medicine.  Clinical Course as of 01/21/23 0641  Wed Jan 21, 2023  0148 Generalized weakness for the past 1 day.  A "cold" over the past 1 week [DS]  0432 Reassessed and discussed plan of care.  Discussed antibiotics, ambulatory trial with pulse ox and reassessment. [DS]  1610 Pulse ox down to 89%  [DS]    Clinical Course User Index [DS] Delton Prairie, MD     FINAL CLINICAL IMPRESSION(S) / ED DIAGNOSES   Final diagnoses:  Hypoxia  Community acquired pneumonia of left lower lobe of lung     Rx / DC Orders   ED Discharge Orders     None        Note:   This document was prepared using Dragon voice recognition software and may include unintentional dictation errors.   Delton Prairie, MD 01/21/23 [phone]

## 2023-01-21 NOTE — H&P (Addendum)
History and Physical    Jose Porter ZDG:[id number] DOB: 08-09-1929 DOA: 01/20/2023  Referring MD/NP/PA:   PCP: Dione Housekeeper, MD   Patient coming from:  The patient is coming from home.     Chief Complaint: cough, SOB and weakness  HPI: Jose Porter is a 89 y.o. male with medical history significant of HTN, HLD, CKD-3a, hyponatremia, who presents with cough, SOB and weakness.  Patient states that he has dry cough and shortness of breath for almost a week.  No chest pain, fever or chills.  Denies nausea, vomiting, diarrhea or abdominal pain.  No symptoms of UTI.  He reports generalized weakness, no unilateral numbness or tinglings extremities.  No facial droop or slurred speech.   Data reviewed independently and ED Course: pt was found to have WBC 10.6, sodium 131, trop 33 --> 31, lactic acid 0.8, slightly worsening renal function, negative COVID PCR, BNP 111, temperature normal, blood pressure 130/68, heart rate 94, RR 26, oxygen saturation 91% on room air (oxygen saturation decreased to 88% on ambulation), chest x-ray showed mild right basilar atelectasis.  CTA negative for large PE, but showed possible bronchitis.  Patient is placed on TeleMed follow-up patient  CTA: 1. No large-vessel arterial embolus. The small peripheral subsegmental arteries are unopacified and not evaluated. 2. Cardiomegaly with prominence of the superior pulmonary veins. No pulmonary edema. 3. Aortic and coronary artery atherosclerosis. 4. Moderate elevation of the right diaphragm which could be due to eventration or paresis, with overlying atelectasis in the right middle and lower lobes. 5. Small consolidation or atelectasis in the lingular base. 6. Diffuse bronchial thickening consistent with bronchitis or reactive airways disease. 7. Small hiatal hernia.   Aortic Atherosclerosis (ICD10-I70.0).    EKG: I have personally reviewed.   Review of Systems:   General: no fevers, chills, no  body weight gain, has fatigue HEENT: no blurry vision, hearing changes or sore throat Respiratory:has dyspnea, coughing, no wheezing CV: no chest pain, no palpitations GI: no nausea, vomiting, abdominal pain, diarrhea, constipation GU: no dysuria, burning on urination, increased urinary frequency, hematuria  Ext: no leg edema Neuro: no unilateral weakness, numbness, or tingling, no vision change or hearing loss Skin: no rash, no skin tear. MSK: No muscle spasm, no deformity, no limitation of range of movement in spin Heme: No easy bruising.  Travel history: No recent long distant travel.   Allergy:  Allergies  Allergen Reactions   Lisinopril Swelling    Past Medical History:  Diagnosis Date   Hypertension     Past Surgical History:  Procedure Laterality Date   HERNIA REPAIR      Social History:  reports that he has never smoked. He has never used smokeless tobacco. He reports that he does not drink alcohol and does not use drugs.  Family History: History reviewed. No pertinent family history. I have reviewed with patient about family medical history, but patient does not know his family medical history in detail.  He states that he has a brother who does not have any significant medical history.   Prior to Admission medications   Medication Sig Start Date End Date Taking? Authorizing Provider  amoxicillin (AMOXIL) 500 MG tablet Take 1 tablet (500 mg total) by mouth 3 (three) times daily. 04/19/22   Triplett, Rulon Eisenmenger B, FNP  Calcium Carb-Cholecalciferol 600-10 MG-MCG CAPS Take 1 capsule by mouth 2 (two) times daily. 08/25/08   [provider]  carvedilol (COREG) 3.125 MG tablet Take 3.125 mg by mouth 2 (  two) times daily. 03/07/22   [provider]  ciprofloxacin-dexamethasone (CIPRODEX) OTIC suspension Place 4 drops into the left ear 2 (two) times daily. 04/19/22   Triplett, Rulon Eisenmenger B, FNP  finasteride (PROSCAR) 5 MG tablet Take 5 mg by mouth daily. 02/27/22   [provider]  hydrochlorothiazide (HYDRODIURIL) 25 MG tablet Take 25 mg by mouth daily. 01/27/22   [provider]  Multiple Vitamin (MULTIVITAMIN) capsule Take 1 capsule by mouth daily.    [provider]  POTASSIUM PHOSPHATE PO Take 1 tablet by mouth daily.    [provider]  simvastatin (ZOCOR) 20 MG tablet Take 20 mg by mouth at bedtime. 03/03/22   [provider]  Study - CAPTIVA - aspirin 81 mg tablet (PI-Sethi) Chew 1 tablet by mouth daily. 08/25/08   [provider]  vitamin B-12 (CYANOCOBALAMIN) 1000 MCG tablet Take 1 tablet by mouth daily.    [provider]    Physical Exam: Vitals:   01/21/23 0625 01/21/23 0630 01/21/23 0700 01/21/23 0730  BP:  130/68 129/67 139/77  Pulse:  80 74 79  Resp:  (!) 25 (!) 23 (!) 21  Temp:      TempSrc:      SpO2: (!) 88% 91% 91% 96%  Weight:      Height:       General: Not in acute distress HEENT:       Eyes: PERRL, EOMI, no scleral icterus.       ENT: No discharge from the ears and nose, no pharynx injection, no tonsillar enlargement.        Neck: No JVD, no bruit, no mass felt. Heme: No neck lymph node enlargement. Cardiac: S1/S2, RRR, No murmurs, No gallops or rubs. Respiratory: Has coarse breathing sound bilaterally GI: Soft, nondistended, nontender, no rebound pain, no organomegaly, BS present. GU: No hematuria Ext: No pitting leg edema bilaterally. 1+DP/PT pulse bilaterally. Musculoskeletal: No joint deformities, No joint redness or warmth, no limitation of ROM in spin. Skin: No rashes.  Neuro: Alert, oriented X3, cranial nerves II-XII grossly intact, moves all extremities normally.  Psych: Patient is not psychotic, no suicidal or hemocidal ideation.  Labs on Admission: I have personally reviewed following labs and imaging studies  CBC: Recent Labs  Lab 01/20/23 2005  WBC 10.6*  HGB 13.3  HCT 40.2  MCV 91.6  PLT 196   Basic Metabolic Panel: Recent Labs  Lab  01/20/23 2005  NA 131*  K 3.8  CL 96*  CO2 25  GLUCOSE 160*  BUN 24*  CREATININE 1.41*  CALCIUM 8.7*   GFR: Estimated Creatinine Clearance: 37.5 mL/min (A) (by C-G formula based on SCr of 1.41 mg/dL (H)). Liver Function Tests: No results for input(s): "AST", "ALT", "ALKPHOS", "BILITOT", "PROT", "ALBUMIN" in the last 168 hours. No results for input(s): "LIPASE", "AMYLASE" in the last 168 hours. No results for input(s): "AMMONIA" in the last 168 hours. Coagulation Profile: No results for input(s): "INR", "PROTIME" in the last 168 hours. Cardiac Enzymes: No results for input(s): "CKTOTAL", "CKMB", "CKMBINDEX", "TROPONINI" in the last 168 hours. BNP (last 3 results) No results for input(s): "PROBNP" in the last 8760 hours. HbA1C: No results for input(s): "HGBA1C" in the last 72 hours. CBG: No results for input(s): "GLUCAP" in the last 168 hours. Lipid Profile: No results for input(s): "CHOL", "HDL", "LDLCALC", "TRIG", "CHOLHDL", "LDLDIRECT" in the last 72 hours. Thyroid Function Tests: No results for input(s): "TSH", "T4TOTAL", "FREET4", "T3FREE", "THYROIDAB" in the last 72 hours.  Anemia Panel: No results for input(s): "VITAMINB12", "FOLATE", "FERRITIN", "TIBC", "IRON", "RETICCTPCT" in the last 72 hours. Urine analysis:    Component Value Date/Time   COLORURINE STRAW (A) 01/21/2023 0337   APPEARANCEUR CLEAR (A) 01/21/2023 0337   LABSPEC 1.021 01/21/2023 0337   PHURINE 6.0 01/21/2023 0337   GLUCOSEU NEGATIVE 01/21/2023 0337   HGBUR NEGATIVE 01/21/2023 0337   BILIRUBINUR NEGATIVE 01/21/2023 0337   KETONESUR NEGATIVE 01/21/2023 0337   PROTEINUR NEGATIVE 01/21/2023 0337   NITRITE NEGATIVE 01/21/2023 0337   LEUKOCYTESUR NEGATIVE 01/21/2023 0337   Sepsis Labs: @LABRCNTIP (procalcitonin:4,lacticidven:4) ) Recent Results (from the past 240 hour(s))  SARS Coronavirus 2 by RT PCR (hospital order, performed in [facility] hospital lab) *cepheid single result test* Anterior Nasal  Swab     Status: None   Collection Time: 01/20/23  8:06 PM   Specimen: Anterior Nasal Swab  Result Value Ref Range Status   SARS Coronavirus 2 by RT PCR NEGATIVE NEGATIVE Final    Comment: (NOTE) SARS-CoV-2 target nucleic acids are NOT DETECTED.  The SARS-CoV-2 RNA is generally detectable in upper and lower respiratory specimens during the acute phase of infection. The lowest concentration of SARS-CoV-2 viral copies this assay can detect is 250 copies / mL. A negative result does not preclude SARS-CoV-2 infection and should not be used as the sole basis for treatment or other patient management decisions.  A negative result may occur with improper specimen collection / handling, submission of specimen other than nasopharyngeal swab, presence of viral mutation(s) within the areas targeted by this assay, and inadequate number of viral copies (<250 copies / mL). A negative result must be combined with clinical observations, patient history, and epidemiological information.  Fact Sheet for Patients:   RoadLapTop.co.za  Fact Sheet for Healthcare Providers: http://kim-miller.com/  This test is not yet approved or  cleared by the [place] FDA and has been authorized for detection and/or diagnosis of SARS-CoV-2 by FDA under an Emergency Use Authorization (EUA).  This EUA will remain in effect (meaning this test can be used) for the duration of the COVID-19 declaration under Section 564(b)(1) of the Act, 21 U.S.C. section 360bbb-3(b)(1), unless the authorization is terminated or revoked sooner.  Performed at [facility], [address], [place], [place] [address]      Radiological Exams on Admission: CT Angio Chest PE W and/or Wo Contrast  Result Date: 01/21/2023 CLINICAL DATA:  Shortness of breath and coughing. Weakness and dyspnea. EXAM: CT ANGIOGRAPHY CHEST WITH CONTRAST TECHNIQUE: Multidetector CT imaging of the chest was  performed using the standard protocol during bolus administration of intravenous contrast. Multiplanar CT image reconstructions and MIPs were obtained to evaluate the vascular anatomy. RADIATION DOSE REDUCTION: This exam was performed according to the departmental dose-optimization program which includes automated exposure control, adjustment of the mA and/or kV according to patient size and/or use of iterative reconstruction technique. CONTRAST:  75mL OMNIPAQUE IOHEXOL 350 MG/ML SOLN COMPARISON:  AP chest portable today, PA and lateral chest 03/24/2022 FINDINGS: Cardiovascular: The pulmonary arteries are normal in caliber and do not show embolic filling defects in the large vessels. The small subsegmental peripheral arteries are unopacified and not evaluated. There is mild cardiomegaly with a left chamber predominance. There is prominence of the superior pulmonary veins. There are calcifications in the aorta and great vessels but no aneurysm, stenosis or dissection. There is mild tortuosity in the descending aorta. There are two-vessel coronary artery calcific plaques in the LAD and right coronary artery, scattered calcification  in the mitral annulus and aortic valve leaflets. There is no pericardial effusion. Mediastinum/Nodes: No enlarged mediastinal, hilar, or axillary lymph nodes. Thyroid gland, trachea, and esophagus demonstrate no significant findings. Small hiatal hernia. Lungs/Pleura: No pleural effusion, thickening or pneumothorax. There is moderate elevation of the right diaphragm which could be due to eventration or paresis, with overlying atelectasis in the right middle and lower lobes. There are scattered calcified granulomas. There is diffuse bronchial thickening. There is a small consolidation or atelectasis in the lingular base. Linear pleural calcifications are present in the medial apices. Rest of both lungs are generally clear. There is no visible bronchial plugging. Upper Abdomen: Colonic  interposition of the hepatic flexure beneath the elevated right diaphragm. No acute abnormal findings. Musculoskeletal: Asymmetric DJD right shoulder. Osteopenia mild degenerative change of the spine. The ribcage is intact. Review of the MIP images confirms the above findings. IMPRESSION: 1. No large-vessel arterial embolus. The small peripheral subsegmental arteries are unopacified and not evaluated. 2. Cardiomegaly with prominence of the superior pulmonary veins. No pulmonary edema. 3. Aortic and coronary artery atherosclerosis. 4. Moderate elevation of the right diaphragm which could be due to eventration or paresis, with overlying atelectasis in the right middle and lower lobes. 5. Small consolidation or atelectasis in the lingular base. 6. Diffuse bronchial thickening consistent with bronchitis or reactive airways disease. 7. Small hiatal hernia. Aortic Atherosclerosis (ICD10-I70.0). Electronically Signed   By: Almira Bar M.D.   On: 01/21/2023 02:53   DG Chest Port 1 View  Result Date: 01/20/2023 CLINICAL DATA:  Shortness of breath and cough EXAM: PORTABLE CHEST 1 VIEW COMPARISON:  03/24/2022 FINDINGS: Cardiac shadow is stable. The lungs are well aerated bilaterally. Elevation the right hemidiaphragm is noted. Mild right basilar atelectasis is seen. No effusion is noted. No pneumothorax is noted. IMPRESSION: Mild right basilar atelectasis. Electronically Signed   By: Alcide Clever M.D.   On: 01/20/2023 20:37      Assessment/Plan Principal Problem:   Acute bronchitis Active Problems:   Myocardial injury   Essential hypertension   HLD (hyperlipidemia)   Chronic kidney disease, stage 3a (HCC)   Hyponatremia   Assessment and Plan:  Acute bronchitis: pt has oxygen desaturation to 88% on ambulation.  CTA negative for large PE, showed possible bronchitis.  Patient has mild leukocytosis with WBC 10.6 and no fever (temperature 98.7), lactic acid is normal 0.8, clinically does not seem to have  sepsis.  - Place in tele bed for obs - IV Rocephin and azithromycin - Incentive spirometry - Mucinex for cough  - Bronchodilators - Urine legionella and S. pneumococcal antigen - Follow up blood culture x2, sputum culture   Myocardial injury: trop 33 --> 31. No CP -will continue zocor - ASA 81 mg daily  Essential hypertension -Hold HCTZ due to hyponatremia and worsening renal function -Coreg -IV hydralazine as needed  HLD (hyperlipidemia) -Coreg  Chronic kidney disease, stage 3a (HCC): Slightly worsening baseline.  Baseline creatinine 1.20 03/27/2022.  His creatinine is 1.41, BUN 24.  Likely due to dehydration and continuation of HCTZ. -Hold HCTZ -Avoid using renal toxic medications -Follow-up by BMP  Hyponatremia: Sodium 131, mild hyponatremia. -Hold HCTZ       DVT ppx: SQ Lovenox  Code Status: Full code per pt  Family Communication:   Yes, patient's wife   at bed side.     Disposition Plan:  Anticipate discharge back to previous environment  Consults called:  none  Admission status and Level of care: Telemetry  Medical:   for obs    Dispo: The patient is from: Home              Anticipated d/c is to: Home              Anticipated d/c date is: 1 day              Patient currently is not medically stable to d/c.    Severity of Illness:  The appropriate patient status for this patient is OBSERVATION. Observation status is judged to be reasonable and necessary in order to provide the required intensity of service to ensure the patient's safety. The patient's presenting symptoms, physical exam findings, and initial radiographic and laboratory data in the context of their medical condition is felt to place them at decreased risk for further clinical deterioration. Furthermore, it is anticipated that the patient will be medically stable for discharge from the hospital within 2 midnights of admission.        Date of Service 01/21/2023    [facility]     If 7PM-7AM, please contact night-coverage [email or web address] 01/21/2023, 8:05 AM

## 2023-01-22 DIAGNOSIS — R0902 Hypoxemia: Secondary | ICD-10-CM | POA: Diagnosis present

## 2023-01-22 DIAGNOSIS — I5A Non-ischemic myocardial injury (non-traumatic): Secondary | ICD-10-CM | POA: Diagnosis present

## 2023-01-22 DIAGNOSIS — E785 Hyperlipidemia, unspecified: Secondary | ICD-10-CM | POA: Diagnosis present

## 2023-01-22 DIAGNOSIS — Z888 Allergy status to other drugs, medicaments and biological substances status: Secondary | ICD-10-CM | POA: Diagnosis not present

## 2023-01-22 DIAGNOSIS — J209 Acute bronchitis, unspecified: Secondary | ICD-10-CM | POA: Diagnosis present

## 2023-01-22 DIAGNOSIS — J9811 Atelectasis: Secondary | ICD-10-CM | POA: Diagnosis present

## 2023-01-22 DIAGNOSIS — I2510 Atherosclerotic heart disease of native coronary artery without angina pectoris: Secondary | ICD-10-CM | POA: Diagnosis present

## 2023-01-22 DIAGNOSIS — I10 Essential (primary) hypertension: Secondary | ICD-10-CM | POA: Diagnosis not present

## 2023-01-22 DIAGNOSIS — J40 Bronchitis, not specified as acute or chronic: Secondary | ICD-10-CM | POA: Diagnosis present

## 2023-01-22 DIAGNOSIS — K449 Diaphragmatic hernia without obstruction or gangrene: Secondary | ICD-10-CM | POA: Diagnosis present

## 2023-01-22 DIAGNOSIS — T502X5A Adverse effect of carbonic-anhydrase inhibitors, benzothiadiazides and other diuretics, initial encounter: Secondary | ICD-10-CM | POA: Diagnosis present

## 2023-01-22 DIAGNOSIS — I1310 Hypertensive heart and chronic kidney disease without heart failure, with stage 1 through stage 4 chronic kidney disease, or unspecified chronic kidney disease: Secondary | ICD-10-CM | POA: Diagnosis present

## 2023-01-22 DIAGNOSIS — E871 Hypo-osmolality and hyponatremia: Secondary | ICD-10-CM | POA: Diagnosis present

## 2023-01-22 DIAGNOSIS — I700 Atherosclerosis of aorta: Secondary | ICD-10-CM | POA: Diagnosis present

## 2023-01-22 DIAGNOSIS — E860 Dehydration: Secondary | ICD-10-CM | POA: Diagnosis present

## 2023-01-22 DIAGNOSIS — Z8616 Personal history of COVID-19: Secondary | ICD-10-CM | POA: Diagnosis not present

## 2023-01-22 DIAGNOSIS — E213 Hyperparathyroidism, unspecified: Secondary | ICD-10-CM | POA: Diagnosis present

## 2023-01-22 DIAGNOSIS — Y92009 Unspecified place in unspecified non-institutional (private) residence as the place of occurrence of the external cause: Secondary | ICD-10-CM | POA: Diagnosis not present

## 2023-01-22 DIAGNOSIS — N1831 Chronic kidney disease, stage 3a: Secondary | ICD-10-CM | POA: Diagnosis present

## 2023-01-22 DIAGNOSIS — Z79899 Other long term (current) drug therapy: Secondary | ICD-10-CM | POA: Diagnosis not present

## 2023-01-22 LAB — CBC
HCT: 37.7 % — ABNORMAL LOW (ref 39.0–52.0)
Hemoglobin: 12.5 g/dL — ABNORMAL LOW (ref 13.0–17.0)
MCH: 30 pg (ref 26.0–34.0)
MCHC: 33.2 g/dL (ref 30.0–36.0)
MCV: 90.6 fL (ref 80.0–100.0)
Platelets: 169 10*3/uL (ref 150–400)
RBC: 4.16 MIL/uL — ABNORMAL LOW (ref 4.22–5.81)
RDW: 13.8 % (ref 11.5–15.5)
WBC: 7 10*3/uL (ref 4.0–10.5)
nRBC: 0 % (ref 0.0–0.2)

## 2023-01-22 LAB — BASIC METABOLIC PANEL
Anion gap: 11 (ref 5–15)
BUN: 24 mg/dL — ABNORMAL HIGH (ref 8–23)
CO2: 25 mmol/L (ref 22–32)
Calcium: 8.7 mg/dL — ABNORMAL LOW (ref 8.9–10.3)
Chloride: 95 mmol/L — ABNORMAL LOW (ref 98–111)
Creatinine, Ser: 1.16 mg/dL (ref 0.61–1.24)
GFR, Estimated: 59 mL/min — ABNORMAL LOW (ref >60)
Glucose, Bld: 102 mg/dL — ABNORMAL HIGH (ref 70–99)
Potassium: 3.9 mmol/L (ref 3.5–5.1)
Sodium: 131 mmol/L — ABNORMAL LOW (ref 135–145)

## 2023-01-22 LAB — STREP PNEUMONIAE URINARY ANTIGEN: Strep Pneumo Urinary Antigen: NEGATIVE

## 2023-01-22 LAB — CULTURE, BLOOD (ROUTINE X 2)

## 2023-01-22 MED ORDER — IPRATROPIUM-ALBUTEROL 0.5-2.5 (3) MG/3ML IN SOLN: 3.0000 mL | Freq: Four times a day (QID) | RESPIRATORY_TRACT | Status: DC | PRN

## 2023-01-22 MED ORDER — AZITHROMYCIN 250 MG PO TABS
500.0000 mg | ORAL_TABLET | Freq: Every day | ORAL | Status: DC
  Administered 2023-01-23: 500 mg via ORAL
  Filled 2023-01-22: qty 2

## 2023-01-22 NOTE — Progress Notes (Signed)
PROGRESS NOTE    Jose Porter  ZOX:[id number] DOB: [birth year] DOA: 01/20/2023 PCP: Dione Housekeeper, MD   Assessment & Plan:   Principal Problem:   Acute bronchitis Active Problems:   Myocardial injury   Essential hypertension   HLD (hyperlipidemia)   Chronic kidney disease, stage 3a (HCC)   Hyponatremia  Assessment and Plan: Acute bronchitis: pt has oxygen desaturation to 88% on ambulation.  CTA negative for large PE, showed possible bronchitis. Clinically not septic. Continue on IV azithromycin, rocephin bronchodilators & encourage incentive spirometry. Strep is neg. Legionella is pending    Myocardial injury: troponins 33 --> 31. No CP.   HTN: continue on home dose of coreg. Holding HCTZ   HLD: continue on statin    CKDIIIa: baseline Cr 1.20. Cr is trending down from day prior    Hyponatremia: likely secondary to HCTZ use. Holding HCTZ. Na is unchanged from day prior         DVT prophylaxis: lovenox  Code Status: full  Family Communication: called pt's daughter, Stark Bray, no answer so I left a voicemail  Disposition Plan:  likely d/c back home   Level of care: Telemetry Medical  Status is: Inpatient Remains inpatient appropriate because: severity of illness      Consultants:    Procedures:   Antimicrobials: rocephin, azithromycin    Subjective: Pt c/o shortness of breath  Objective: Vitals:   01/21/23 0929 01/21/23 1041 01/21/23 1900 01/22/23 0419  BP: (!) 142/77  138/77 122/73  Pulse: 81  80 75  Resp: 20  20 20   Temp: 98.3 F (36.8 C)  98.3 F (36.8 C) 98.9 F (37.2 C)  TempSrc: Oral  Oral   SpO2: 96% 96% 97% 93%  Weight:      Height:        Intake/Output Summary (Last 24 hours) at 01/22/2023 0813 Last data filed at 01/22/2023 0630 Gross per 24 hour  Intake 252.84 ml  Output 300 ml  Net -47.16 ml   Filed Weights   01/20/23 2002  Weight: 81.6 kg    Examination:  General exam: Appears calm and comfortable  Respiratory  system: diminished breath sounds b/l  Cardiovascular system: S1 & S2 +. No rubs, gallops or clicks. Gastrointestinal system: Abdomen is nondistended, soft and nontender. Normal bowel sounds heard. Central nervous system: Alert and oriented. Moves all extremities  Psychiatry: Judgement and insight appear normal. Mood & affect appropriate.     Data Reviewed: I have personally reviewed following labs and imaging studies  CBC: Recent Labs  Lab 01/20/23 2005 01/22/23 0528  WBC 10.6* 7.0  HGB 13.3 12.5*  HCT 40.2 37.7*  MCV 91.6 90.6  PLT 196 169   Basic Metabolic Panel: Recent Labs  Lab 01/20/23 2005 01/22/23 0528  NA 131* 131*  K 3.8 3.9  CL 96* 95*  CO2 25 25  GLUCOSE 160* 102*  BUN 24* 24*  CREATININE 1.41* 1.16  CALCIUM 8.7* 8.7*   GFR: Estimated Creatinine Clearance: 45.5 mL/min (by C-G formula based on SCr of 1.16 mg/dL). Liver Function Tests: No results for input(s): "AST", "ALT", "ALKPHOS", "BILITOT", "PROT", "ALBUMIN" in the last 168 hours. No results for input(s): "LIPASE", "AMYLASE" in the last 168 hours. No results for input(s): "AMMONIA" in the last 168 hours. Coagulation Profile: No results for input(s): "INR", "PROTIME" in the last 168 hours. Cardiac Enzymes: No results for input(s): "CKTOTAL", "CKMB", "CKMBINDEX", "TROPONINI" in the last 168 hours. BNP (last 3 results) No results for input(s): "PROBNP" in the  last 8760 hours. HbA1C: No results for input(s): "HGBA1C" in the last 72 hours. CBG: No results for input(s): "GLUCAP" in the last 168 hours. Lipid Profile: No results for input(s): "CHOL", "HDL", "LDLCALC", "TRIG", "CHOLHDL", "LDLDIRECT" in the last 72 hours. Thyroid Function Tests: No results for input(s): "TSH", "T4TOTAL", "FREET4", "T3FREE", "THYROIDAB" in the last 72 hours. Anemia Panel: No results for input(s): "VITAMINB12", "FOLATE", "FERRITIN", "TIBC", "IRON", "RETICCTPCT" in the last 72 hours. Sepsis Labs: Recent Labs  Lab  01/21/23 0726 01/21/23 2117  PROCALCITON  --  <0.10  LATICACIDVEN 0.8  --     Recent Results (from the past 240 hour(s))  SARS Coronavirus 2 by RT PCR (hospital order, performed in [facility] hospital lab) *cepheid single result test* Anterior Nasal Swab     Status: None   Collection Time: 01/20/23  8:06 PM   Specimen: Anterior Nasal Swab  Result Value Ref Range Status   SARS Coronavirus 2 by RT PCR NEGATIVE NEGATIVE Final    Comment: (NOTE) SARS-CoV-2 target nucleic acids are NOT DETECTED.  The SARS-CoV-2 RNA is generally detectable in upper and lower respiratory specimens during the acute phase of infection. The lowest concentration of SARS-CoV-2 viral copies this assay can detect is 250 copies / mL. A negative result does not preclude SARS-CoV-2 infection and should not be used as the sole basis for treatment or other patient management decisions.  A negative result may occur with improper specimen collection / handling, submission of specimen other than nasopharyngeal swab, presence of viral mutation(s) within the areas targeted by this assay, and inadequate number of viral copies (<250 copies / mL). A negative result must be combined with clinical observations, patient history, and epidemiological information.  Fact Sheet for Patients:   RoadLapTop.co.za  Fact Sheet for Healthcare Providers: http://kim-miller.com/  This test is not yet approved or  cleared by the [place] FDA and has been authorized for detection and/or diagnosis of SARS-CoV-2 by FDA under an Emergency Use Authorization (EUA).  This EUA will remain in effect (meaning this test can be used) for the duration of the COVID-19 declaration under Section 564(b)(1) of the Act, 21 U.S.C. section 360bbb-3(b)(1), unless the authorization is terminated or revoked sooner.  Performed at [facility], [address] [place], [place] [address]   Culture,  blood (Routine X 2) w Reflex to ID Panel     Status: None (Preliminary result)   Collection Time: 01/21/23  7:26 AM   Specimen: BLOOD LEFT ARM  Result Value Ref Range Status   Specimen Description BLOOD LEFT ARM  Final   Special Requests   Final    BOTTLES DRAWN AEROBIC AND ANAEROBIC Blood Culture adequate volume   Culture   Final    NO GROWTH <12 HOURS Performed at [facility], [address] [place], [place] [address]    Report Status PENDING  Incomplete  Culture, blood (Routine X 2) w Reflex to ID Panel     Status: None (Preliminary result)   Collection Time: 01/21/23  7:26 AM   Specimen: BLOOD RIGHT ARM  Result Value Ref Range Status   Specimen Description BLOOD RIGHT ARM  Final   Special Requests   Final    BOTTLES DRAWN AEROBIC AND ANAEROBIC Blood Culture results may not be optimal due to an excessive volume of blood received in culture bottles   Culture   Final    NO GROWTH <12 HOURS Performed at [facility], [address] [place], [place] [address]  Report Status PENDING  Incomplete         Radiology Studies: CT Angio Chest PE W and/or Wo Contrast  Result Date: 01/21/2023 CLINICAL DATA:  Shortness of breath and coughing. Weakness and dyspnea. EXAM: CT ANGIOGRAPHY CHEST WITH CONTRAST TECHNIQUE: Multidetector CT imaging of the chest was performed using the standard protocol during bolus administration of intravenous contrast. Multiplanar CT image reconstructions and MIPs were obtained to evaluate the vascular anatomy. RADIATION DOSE REDUCTION: This exam was performed according to the departmental dose-optimization program which includes automated exposure control, adjustment of the mA and/or kV according to patient size and/or use of iterative reconstruction technique. CONTRAST:  75mL OMNIPAQUE IOHEXOL 350 MG/ML SOLN COMPARISON:  AP chest portable today, PA and lateral chest 03/24/2022 FINDINGS: Cardiovascular: The pulmonary arteries are normal in  caliber and do not show embolic filling defects in the large vessels. The small subsegmental peripheral arteries are unopacified and not evaluated. There is mild cardiomegaly with a left chamber predominance. There is prominence of the superior pulmonary veins. There are calcifications in the aorta and great vessels but no aneurysm, stenosis or dissection. There is mild tortuosity in the descending aorta. There are two-vessel coronary artery calcific plaques in the LAD and right coronary artery, scattered calcification in the mitral annulus and aortic valve leaflets. There is no pericardial effusion. Mediastinum/Nodes: No enlarged mediastinal, hilar, or axillary lymph nodes. Thyroid gland, trachea, and esophagus demonstrate no significant findings. Small hiatal hernia. Lungs/Pleura: No pleural effusion, thickening or pneumothorax. There is moderate elevation of the right diaphragm which could be due to eventration or paresis, with overlying atelectasis in the right middle and lower lobes. There are scattered calcified granulomas. There is diffuse bronchial thickening. There is a small consolidation or atelectasis in the lingular base. Linear pleural calcifications are present in the medial apices. Rest of both lungs are generally clear. There is no visible bronchial plugging. Upper Abdomen: Colonic interposition of the hepatic flexure beneath the elevated right diaphragm. No acute abnormal findings. Musculoskeletal: Asymmetric DJD right shoulder. Osteopenia mild degenerative change of the spine. The ribcage is intact. Review of the MIP images confirms the above findings. IMPRESSION: 1. No large-vessel arterial embolus. The small peripheral subsegmental arteries are unopacified and not evaluated. 2. Cardiomegaly with prominence of the superior pulmonary veins. No pulmonary edema. 3. Aortic and coronary artery atherosclerosis. 4. Moderate elevation of the right diaphragm which could be due to eventration or paresis,  with overlying atelectasis in the right middle and lower lobes. 5. Small consolidation or atelectasis in the lingular base. 6. Diffuse bronchial thickening consistent with bronchitis or reactive airways disease. 7. Small hiatal hernia. Aortic Atherosclerosis (ICD10-I70.0). Electronically Signed   By: Almira Bar M.D.   On: 01/21/2023 02:53   DG Chest Port 1 View  Result Date: 01/20/2023 CLINICAL DATA:  Shortness of breath and cough EXAM: PORTABLE CHEST 1 VIEW COMPARISON:  03/24/2022 FINDINGS: Cardiac shadow is stable. The lungs are well aerated bilaterally. Elevation the right hemidiaphragm is noted. Mild right basilar atelectasis is seen. No effusion is noted. No pneumothorax is noted. IMPRESSION: Mild right basilar atelectasis. Electronically Signed   By: Alcide Clever M.D.   On: 01/20/2023 20:37        Scheduled Meds:  aspirin EC  81 mg Oral Daily   calcium-vitamin D   Oral BID   carvedilol  3.125 mg Oral BID   cyanocobalamin  1,000 mcg Oral Daily   enoxaparin (LOVENOX) injection  40 mg Subcutaneous Q24H   finasteride  5 mg Oral Daily   multivitamin with minerals  1 tablet Oral Daily   simvastatin  20 mg Oral QHS   Continuous Infusions:  azithromycin (ZITHROMAX) 500 mg in sodium chloride 0.9 % 250 mL IVPB 500 mg (01/22/23 0558)   cefTRIAXone (ROCEPHIN)  IV 1 g (01/22/23 0557)     LOS: 0 days    Time spent: 35 mins    Charise Killian, MD [facility] Pager [phone]  If 7PM-7AM, please contact night-coverage [email or web address] 01/22/2023, 8:13 AM

## 2023-01-22 NOTE — Progress Notes (Signed)
PHARMACIST - PHYSICIAN COMMUNICATION DR:   Mayford Knife CONCERNING: Antibiotic IV to Oral Route Change Policy  RECOMMENDATION: This patient is receiving azithromycin by the intravenous route.  Based on criteria approved by the Pharmacy and Therapeutics Committee, the antibiotic(s) is/are being converted to the equivalent oral dose form(s).   DESCRIPTION: These criteria include: Patient being treated for a respiratory tract infection, urinary tract infection, cellulitis or clostridium difficile associated diarrhea if on metronidazole The patient is not neutropenic and does not exhibit a GI malabsorption state The patient is eating (either orally or via tube) and/or has been taking other orally administered medications for a least 24 hours The patient is improving clinically and has a Tmax < 100.5  If you have questions about this conversion, please contact the Pharmacy Department  [x]   [phone] )  [facility]  Prince Solian III, PharmD

## 2023-01-22 NOTE — Evaluation (Signed)
Occupational Therapy Evaluation Patient Details Name: Jose Porter MRN: [id number] DOB: August 31, 1929 Today's Date: 01/22/2023   History of Present Illness Pt is a 89 y.o. male presenting to hospital 01/20/23 with c/o cough, SOB, and weakness.  Pt admitted with acute bronchitis and hyponatremia.  PMH includes htn, HLD, hyperparathyroidism, CKD stage 3a.   Clinical Impression   [name] was seen for OT evaluation this date. Prior to hospital admission, pt was MOD I using SPC. Pt lives with spouse in 2 level home. Pt presents to acute OT demonstrating near baseline ADL performance. Pt currently MOD I don B socks seated EOB. SBA + RW sit<>stand and ~20 ft mobility. Pt educated on compression sock mgmt and seated HEP. All education complete, no skilled acute OT needs identified, will sign off. Upon hospital discharge, recommend no OT follow up.   Recommendations for follow up therapy are one component of a multi-disciplinary discharge planning process, led by the attending physician.  Recommendations may be updated based on patient status, additional functional criteria and insurance authorization.   Assistance Recommended at Discharge Intermittent Supervision/Assistance  Patient can return home with the following A little help with walking and/or transfers;Help with stairs or ramp for entrance    Functional Status Assessment  Patient has had a recent decline in their functional status and demonstrates the ability to make significant improvements in function in a reasonable and predictable amount of time.  Equipment Recommendations  None recommended by OT    Recommendations for Other Services       Precautions / Restrictions Precautions Precautions: Fall Restrictions Weight Bearing Restrictions: No      Mobility Bed Mobility               General bed mobility comments: received and left sitting    Transfers Overall transfer level: Needs assistance Equipment used: Rolling  walker (2 wheels) Transfers: Sit to/from Stand Sit to Stand: Supervision                  Balance Overall balance assessment: Needs assistance Sitting-balance support: No upper extremity supported, Feet supported Sitting balance-Leahy Scale: Good     Standing balance support: Bilateral upper extremity supported, During functional activity Standing balance-Leahy Scale: Good                             ADL either performed or assessed with clinical judgement   ADL Overall ADL's : Needs assistance/impaired                                       General ADL Comments: MOD I don B socks seated EOB. SBA + RW simulated toilet t/f. MOD I grooming tasks seated      Pertinent Vitals/Pain Pain Assessment Pain Assessment: No/denies pain     Hand Dominance Right   Extremity/Trunk Assessment Upper Extremity Assessment Upper Extremity Assessment: Overall WFL for tasks assessed   Lower Extremity Assessment Lower Extremity Assessment: Generalized weakness   Cervical / Trunk Assessment Cervical / Trunk Assessment: Kyphotic Cervical / Trunk Exceptions: forward head/shoulders   Communication Communication Communication: HOH   Cognition Arousal/Alertness: Awake/alert Behavior During Therapy: WFL for tasks assessed/performed Overall Cognitive Status: Within Functional Limits for tasks assessed  Home Living Family/patient expects to be discharged to:: Private residence Living Arrangements: Spouse/significant other Available Help at Discharge: Family;Available PRN/intermittently Type of Home: House Home Access: Level entry     Home Layout: Two level;Bed/bath upstairs (sleeps 2nd floor) Alternate Level Stairs-Number of Steps: flight Alternate Level Stairs-Rails: Right Bathroom Shower/Tub: Tub/shower unit   Bathroom Toilet: Handicapped height     Home Equipment: Cane - single  point;Rollator (4 wheels);Grab bars - tub/shower          Prior Functioning/Environment Prior Level of Function : Independent/Modified Independent             Mobility Comments: Pt modified independent ambulating with SPC; uses 4ww in community as needed (so he can sit and rest when needed); no recent falls reported.  Pt reports (for the last few years) he has "crawled" upstairs (on his hands/knees) but holds onto railing and steps down (2 feet to a step in standing) to go downstairs. ADLs Comments: (+) driving; Independent with ADL's        OT Problem List: Decreased activity tolerance      OT Treatment/Interventions:      OT Goals(Current goals can be found in the care plan section) Acute Rehab OT Goals Patient Stated Goal: to go home OT Goal Formulation: With patient Time For Goal Achievement: 02/05/23 Potential to Achieve Goals: Good  OT Frequency:      Co-evaluation              AM-PAC OT "6 Clicks" Daily Activity     Outcome Measure Help from another person eating meals?: None Help from another person taking care of personal grooming?: A Little Help from another person toileting, which includes using toliet, bedpan, or urinal?: A Little Help from another person bathing (including washing, rinsing, drying)?: A Little Help from another person to put on and taking off regular upper body clothing?: None Help from another person to put on and taking off regular lower body clothing?: None 6 Click Score: 21   End of Session Equipment Utilized During Treatment: Rolling walker (2 wheels)  Activity Tolerance: Patient tolerated treatment well Patient left: in chair;with call bell/phone within reach;with chair alarm set  OT Visit Diagnosis: Unsteadiness on feet (R26.81);Muscle weakness (generalized) (M62.81)                Time: 1610-9604 OT Time Calculation (min): 9 min Charges:  OT General Charges $OT Visit: 1 Visit OT Evaluation $OT Eval Low Complexity: 1  Low  Kathie Dike, M.S. OTR/L  01/22/23, 10:55 AM  ascom [phone]

## 2023-01-22 NOTE — Evaluation (Signed)
Physical Therapy Evaluation Patient Details Name: Jose Porter MRN: [id number] DOB: 1929-02-01 Today's Date: 01/22/2023  History of Present Illness  Pt is a 89 y.o. male presenting to hospital 01/20/23 with c/o cough, SOB, and weakness.  Pt admitted with acute bronchitis and hyponatremia.  PMH includes htn, HLD, hyperparathyroidism, CKD stage 3a.  Clinical Impression  Prior to hospital admission, pt was modified independent with ambulation (uses [facility] but will use 4ww in community so he can sit and rest as needed); lives with his wife in 2 level home (bedroom 2nd floor).  No c/o pain during session.  Currently pt is SBA semi-supine to sitting edge of bed; CGA with transfers; and CGA to ambulate 180 feet with RW use (trialed SPC use for about 5 feet but pt appearing unsteady so switched to RW; pt appearing steady with RW use during session; pt reports chronic forward flexed posture).  Educated pt on fall prevention and recommendation for sleeping set-up on main floor (so pt would not have to navigate steps upstairs to sleep); pt verbalizing appropriate understanding.  Pt would currently benefit from skilled PT to address noted impairments and functional limitations (see below for any additional details).  Upon hospital discharge, pt would benefit from ongoing therapy.    Recommendations for follow up therapy are one component of a multi-disciplinary discharge planning process, led by the attending physician.  Recommendations may be updated based on patient status, additional functional criteria and insurance authorization.        Assistance Recommended at Discharge Frequent or constant Supervision/Assistance  Patient can return home with the following  A little help with walking and/or transfers;A little help with bathing/dressing/bathroom;Assistance with cooking/housework;Assist for transportation;Help with stairs or ramp for entrance    Equipment Recommendations Rolling walker (2 wheels)   Recommendations for Other Services  OT consult    Functional Status Assessment Patient has had a recent decline in their functional status and demonstrates the ability to make significant improvements in function in a reasonable and predictable amount of time.     Precautions / Restrictions Precautions Precautions: Fall Restrictions Weight Bearing Restrictions: No      Mobility  Bed Mobility Overal bed mobility: Needs Assistance Bed Mobility: Supine to Sit     Supine to sit: Supervision, HOB elevated     General bed mobility comments: increased effort to perform on own    Transfers Overall transfer level: Needs assistance Equipment used: Straight cane Transfers: Sit to/from Stand Sit to Stand: Min guard           General transfer comment: increased effort to stand from bed but steady with SPC use and UE support on bed rail    Ambulation/Gait Ambulation/Gait assistance: Min guard Gait Distance (Feet): 180 Feet Assistive device: Rolling walker (2 wheels)   Gait velocity: decreased     General Gait Details: pt with forward flexed posture (pt reports baseline); partial step through gait pattern; decreased B LE foot clearance; steady  Stairs            Wheelchair Mobility    Modified Rankin (Stroke Patients Only)       Balance Overall balance assessment: Needs assistance Sitting-balance support: No upper extremity supported, Feet supported Sitting balance-Leahy Scale: Good Sitting balance - Comments: steady sitting reaching within BOS   Standing balance support: Bilateral upper extremity supported, During functional activity, Reliant on assistive device for balance Standing balance-Leahy Scale: Good Standing balance comment: steady ambulating with RW use  Pertinent Vitals/Pain Pain Assessment Pain Assessment: No/denies pain HR stable and WFL throughout treatment session.  O2 sats 92% at rest but 91-92%  with ambulation (all on room air).    Home Living Family/patient expects to be discharged to:: Private residence Living Arrangements: Spouse/significant other Available Help at Discharge: Family;Available PRN/intermittently Type of Home: House Home Access: Level entry     Alternate Level Stairs-Number of Steps: flight Home Layout: Two level;Bed/bath upstairs (sleeps 2nd floor) Home Equipment: Cane - single point;Rollator (4 wheels);Grab bars - tub/shower      Prior Function Prior Level of Function : Independent/Modified Independent             Mobility Comments: Pt modified independent ambulating with SPC; uses 4ww in community as needed (so he can sit and rest when needed); no recent falls reported.  Pt reports (for the last few years) he has "crawled" upstairs (on his hands/knees) but holds onto railing and steps down (2 feet to a step in standing) to go downstairs. ADLs Comments: (+) driving; Independent with ADL's     Hand Dominance        Extremity/Trunk Assessment   Upper Extremity Assessment Upper Extremity Assessment: Defer to OT evaluation;Overall WFL for tasks assessed    Lower Extremity Assessment Lower Extremity Assessment: Generalized weakness    Cervical / Trunk Assessment Cervical / Trunk Assessment: Other exceptions;Kyphotic Cervical / Trunk Exceptions: forward head/shoulders  Communication   Communication: HOH  Cognition Arousal/Alertness: Awake/alert Behavior During Therapy: WFL for tasks assessed/performed Overall Cognitive Status: Within Functional Limits for tasks assessed                                          General Comments  Nursing cleared pt for participation in physical therapy.  Pt agreeable to PT session.    Exercises  Gait training with RW   Assessment/Plan    PT Assessment Patient needs continued PT services  PT Problem List Decreased strength;Decreased activity tolerance;Decreased balance;Decreased  mobility;Decreased knowledge of use of DME;Decreased knowledge of precautions       PT Treatment Interventions DME instruction;Gait training;Stair training;Functional mobility training;Therapeutic activities;Therapeutic exercise;Balance training;Patient/family education    PT Goals (Current goals can be found in the Care Plan section)  Acute Rehab PT Goals Patient Stated Goal: to improve strength PT Goal Formulation: With patient Time For Goal Achievement: 02/05/23 Potential to Achieve Goals: Good    Frequency Min 3X/week     Co-evaluation               AM-PAC PT "6 Clicks" Mobility  Outcome Measure Help needed turning from your back to your side while in a flat bed without using bedrails?: None Help needed moving from lying on your back to sitting on the side of a flat bed without using bedrails?: A Little Help needed moving to and from a bed to a chair (including a wheelchair)?: A Little Help needed standing up from a chair using your arms (e.g., wheelchair or bedside chair)?: A Little Help needed to walk in hospital room?: A Little Help needed climbing 3-5 steps with a railing? : A Little 6 Click Score: 19    End of Session Equipment Utilized During Treatment: Gait belt Activity Tolerance: Patient tolerated treatment well Patient left: in chair (with OT present for OT evaluation) Nurse Communication: Mobility status;Precautions PT Visit Diagnosis: Other abnormalities of gait and mobility (R26.89);Muscle weakness (  generalized) (M62.81)    Time: 0950-1005 PT Time Calculation (min) (ACUTE ONLY): 15 min   Charges:   PT Evaluation $PT Eval Low Complexity: 1 Low PT Treatments $Gait Training: 8-22 mins       Hendricks Limes, PT 01/22/23, 10:25 AM

## 2023-01-22 NOTE — Plan of Care (Signed)

## 2023-01-23 DIAGNOSIS — J209 Acute bronchitis, unspecified: Secondary | ICD-10-CM | POA: Diagnosis not present

## 2023-01-23 DIAGNOSIS — E871 Hypo-osmolality and hyponatremia: Secondary | ICD-10-CM | POA: Diagnosis not present

## 2023-01-23 DIAGNOSIS — I10 Essential (primary) hypertension: Secondary | ICD-10-CM | POA: Diagnosis not present

## 2023-01-23 LAB — CULTURE, BLOOD (ROUTINE X 2): Special Requests: ADEQUATE

## 2023-01-23 LAB — CBC
HCT: 37.5 % — ABNORMAL LOW (ref 39.0–52.0)
Hemoglobin: 12.4 g/dL — ABNORMAL LOW (ref 13.0–17.0)
MCH: 29.9 pg (ref 26.0–34.0)
MCHC: 33.1 g/dL (ref 30.0–36.0)
MCV: 90.4 fL (ref 80.0–100.0)
Platelets: 179 10*3/uL (ref 150–400)
RBC: 4.15 MIL/uL — ABNORMAL LOW (ref 4.22–5.81)
RDW: 13.6 % (ref 11.5–15.5)
WBC: 6.1 10*3/uL (ref 4.0–10.5)
nRBC: 0 % (ref 0.0–0.2)

## 2023-01-23 LAB — LEGIONELLA PNEUMOPHILA SEROGP 1 UR AG: L. pneumophila Serogp 1 Ur Ag: NEGATIVE

## 2023-01-23 LAB — BASIC METABOLIC PANEL
Anion gap: 9 (ref 5–15)
BUN: 25 mg/dL — ABNORMAL HIGH (ref 8–23)
CO2: 27 mmol/L (ref 22–32)
Calcium: 8.8 mg/dL — ABNORMAL LOW (ref 8.9–10.3)
Chloride: 96 mmol/L — ABNORMAL LOW (ref 98–111)
Creatinine, Ser: 1.08 mg/dL (ref 0.61–1.24)
GFR, Estimated: >60 mL/min (ref >60)
Glucose, Bld: 108 mg/dL — ABNORMAL HIGH (ref 70–99)
Potassium: 3.8 mmol/L (ref 3.5–5.1)
Sodium: 132 mmol/L — ABNORMAL LOW (ref 135–145)

## 2023-01-23 MED ORDER — AZITHROMYCIN 250 MG PO TABS: 250.0000 mg | ORAL_TABLET | Freq: Every day | ORAL | 0 refills | Status: AC

## 2023-01-23 MED ORDER — GUAIFENESIN-CODEINE 100-10 MG/5ML PO SOLN: 5.0000 mL | Freq: Four times a day (QID) | ORAL | 0 refills | Status: AC | PRN

## 2023-01-23 MED ORDER — PREDNISONE 20 MG PO TABS: 40.0000 mg | ORAL_TABLET | Freq: Every day | ORAL | 0 refills | Status: AC

## 2023-01-23 NOTE — TOC Transition Note (Signed)
Transition of Care [facility]) - CM/SW Discharge Note   Patient Details  Name: Jose Porter MRN: [id number] Date of Birth: [birth year]  [facility] CM/SW Contact:  Margarito Liner, LCSW Phone Number: 01/23/2023, 12:40 PM   Clinical Narrative:   Patient has orders to discharge home today. CSW met with patient. RN at bedside. CSW introduced role and explained that PT recommendations would be discussed. Patient declined home health but will contact his PCP if he changes his mind. He stated he already has a RW. No further concerns. CSW signing off.  Final next level of care: Home/Self Care Barriers to Discharge: No Barriers Identified   Patient Goals and CMS Choice      Discharge Placement                  Patient to be transferred to facility by: Daughter   Patient and family notified of of transfer: 01/23/23  Discharge Plan and Services Additional resources added to the After Visit Summary for                                       Social Determinants of Health (SDOH) Interventions SDOH Screenings   Food Insecurity: No Food Insecurity (01/21/2023)  Housing: Low Risk  (01/21/2023)  Transportation Needs: No Transportation Needs (01/21/2023)  Utilities: Not At Risk (01/21/2023)  Tobacco Use: Low Risk  (01/21/2023)     Readmission Risk Interventions     No data to display

## 2023-01-23 NOTE — Discharge Summary (Signed)
Physician Discharge Summary  Jose Porter:[id number] DOB: 10/21/1928 DOA: 01/20/2023  PCP: Dione Housekeeper, MD  Admit date: 01/20/2023 Discharge date: 01/23/2023  Admitted From: home Disposition:  home  Recommendations for Outpatient Follow-up:  Follow up with PCP in 1-2 weeks   Home Health: PT/OT recs HH but pt declined  Equipment/Devices:  Discharge Condition: stable CODE STATUS: full  Diet recommendation: Heart Healthy  Brief/Interim Summary: HPI was taken from Dr. Clyde Lundborg: Jose Porter is a 89 y.o. male with medical history significant of HTN, HLD, CKD-3a, hyponatremia, who presents with cough, SOB and weakness.   Patient states that he has dry cough and shortness of breath for almost a week.  No chest pain, fever or chills.  Denies nausea, vomiting, diarrhea or abdominal pain.  No symptoms of UTI.  He reports generalized weakness, no unilateral numbness or tinglings extremities.  No facial droop or slurred speech.     Data reviewed independently and ED Course: pt was found to have WBC 10.6, sodium 131, trop 33 --> 31, lactic acid 0.8, slightly worsening renal function, negative COVID PCR, BNP 111, temperature normal, blood pressure 130/68, heart rate 94, RR 26, oxygen saturation 91% on room air (oxygen saturation decreased to 88% on ambulation), chest x-ray showed mild right basilar atelectasis.  CTA negative for large PE, but showed possible bronchitis.  Patient is placed on TeleMed follow-up patient   CTA: 1. No large-vessel arterial embolus. The small peripheral subsegmental arteries are unopacified and not evaluated. 2. Cardiomegaly with prominence of the superior pulmonary veins. No pulmonary edema. 3. Aortic and coronary artery atherosclerosis. 4. Moderate elevation of the right diaphragm which could be due to eventration or paresis, with overlying atelectasis in the right middle and lower lobes. 5. Small consolidation or atelectasis in the lingular base. 6.  Diffuse bronchial thickening consistent with bronchitis or reactive airways disease. 7. Small hiatal hernia.   As per Dr. Mayford Knife 5/2-01/23/23: Pt was treated w/ IV rocephin, azithromycin, bronchodilators & incentive spirometry for bronchitis. Pt was d/c home on po azithromycin, prednisone & robitussin to complete the course.   Discharge Diagnoses:  Principal Problem:   Acute bronchitis Active Problems:   Myocardial injury   Essential hypertension   HLD (hyperlipidemia)   Chronic kidney disease, stage 3a (HCC)   Hyponatremia   Bronchitis  Acute bronchitis: pt has oxygen desaturation to 88% on ambulation.  CTA negative for large PE, showed possible bronchitis. Clinically not septic. Continue on IV azithromycin, rocephin bronchodilators & encourage incentive spirometry. Strep is neg. Legionella is pending    Myocardial injury: troponins 33 --> 31. No CP.   HTN: continue on home dose of coreg. Restart HCTZ at d/c    HLD: continue on statin    CKDIIIa: baseline Cr 1.20. Cr is trending down from day prior    Hyponatremia: likely secondary to HCTZ use. Na level is trending up. Restart HCTZ at d/c but should consider discussing this medication use w/ PCP      Discharge Instructions  Discharge Instructions     Diet - low sodium heart healthy   Complete by: As directed    Discharge instructions   Complete by: As directed    F/u w/ PCP in 1-2 weeks   Increase activity slowly   Complete by: As directed       Allergies as of 01/23/2023       Reactions   Lisinopril Swelling        Medication List     STOP  taking these medications    amoxicillin 500 MG tablet Commonly known as: AMOXIL   ciprofloxacin-dexamethasone OTIC suspension Commonly known as: CIPRODEX       TAKE these medications    aspirin EC 81 MG tablet Take 81 mg by mouth daily. Swallow whole.   azithromycin 250 MG tablet Commonly known as: Zithromax Take 1 tablet (250 mg total) by mouth daily for 3  days.   Calcium Carb-Cholecalciferol 600-10 MG-MCG Caps Take 1 capsule by mouth 2 (two) times daily.   carvedilol 3.125 MG tablet Commonly known as: COREG Take 3.125 mg by mouth 2 (two) times daily.   cyanocobalamin 1000 MCG tablet Commonly known as: VITAMIN B12 Take 1 tablet by mouth daily.   finasteride 5 MG tablet Commonly known as: PROSCAR Take 5 mg by mouth daily.   guaiFENesin-codeine 100-10 MG/5ML syrup Take 5 mLs by mouth every 6 (six) hours as needed for up to 7 days for cough.   hydrochlorothiazide 25 MG tablet Commonly known as: HYDRODIURIL Take 25 mg by mouth daily.   multivitamin capsule Take 1 capsule by mouth daily.   POTASSIUM PHOSPHATE PO Take 1 tablet by mouth daily.   predniSONE 20 MG tablet Commonly known as: DELTASONE Take 2 tablets (40 mg total) by mouth daily for 5 days.   simvastatin 20 MG tablet Commonly known as: ZOCOR Take 20 mg by mouth at bedtime.        Allergies  Allergen Reactions   Lisinopril Swelling    Consultations:    Procedures/Studies: CT Angio Chest PE W and/or Wo Contrast  Result Date: 01/21/2023 CLINICAL DATA:  Shortness of breath and coughing. Weakness and dyspnea. EXAM: CT ANGIOGRAPHY CHEST WITH CONTRAST TECHNIQUE: Multidetector CT imaging of the chest was performed using the standard protocol during bolus administration of intravenous contrast. Multiplanar CT image reconstructions and MIPs were obtained to evaluate the vascular anatomy. RADIATION DOSE REDUCTION: This exam was performed according to the departmental dose-optimization program which includes automated exposure control, adjustment of the mA and/or kV according to patient size and/or use of iterative reconstruction technique. CONTRAST:  75mL OMNIPAQUE IOHEXOL 350 MG/ML SOLN COMPARISON:  AP chest portable today, PA and lateral chest 03/24/2022 FINDINGS: Cardiovascular: The pulmonary arteries are normal in caliber and do not show embolic filling defects in the  large vessels. The small subsegmental peripheral arteries are unopacified and not evaluated. There is mild cardiomegaly with a left chamber predominance. There is prominence of the superior pulmonary veins. There are calcifications in the aorta and great vessels but no aneurysm, stenosis or dissection. There is mild tortuosity in the descending aorta. There are two-vessel coronary artery calcific plaques in the LAD and right coronary artery, scattered calcification in the mitral annulus and aortic valve leaflets. There is no pericardial effusion. Mediastinum/Nodes: No enlarged mediastinal, hilar, or axillary lymph nodes. Thyroid gland, trachea, and esophagus demonstrate no significant findings. Small hiatal hernia. Lungs/Pleura: No pleural effusion, thickening or pneumothorax. There is moderate elevation of the right diaphragm which could be due to eventration or paresis, with overlying atelectasis in the right middle and lower lobes. There are scattered calcified granulomas. There is diffuse bronchial thickening. There is a small consolidation or atelectasis in the lingular base. Linear pleural calcifications are present in the medial apices. Rest of both lungs are generally clear. There is no visible bronchial plugging. Upper Abdomen: Colonic interposition of the hepatic flexure beneath the elevated right diaphragm. No acute abnormal findings. Musculoskeletal: Asymmetric DJD right shoulder. Osteopenia mild degenerative change of  the spine. The ribcage is intact. Review of the MIP images confirms the above findings. IMPRESSION: 1. No large-vessel arterial embolus. The small peripheral subsegmental arteries are unopacified and not evaluated. 2. Cardiomegaly with prominence of the superior pulmonary veins. No pulmonary edema. 3. Aortic and coronary artery atherosclerosis. 4. Moderate elevation of the right diaphragm which could be due to eventration or paresis, with overlying atelectasis in the right middle and lower  lobes. 5. Small consolidation or atelectasis in the lingular base. 6. Diffuse bronchial thickening consistent with bronchitis or reactive airways disease. 7. Small hiatal hernia. Aortic Atherosclerosis (ICD10-I70.0). Electronically Signed   By: Almira Bar M.D.   On: 01/21/2023 02:53   DG Chest Port 1 View  Result Date: 01/20/2023 CLINICAL DATA:  Shortness of breath and cough EXAM: PORTABLE CHEST 1 VIEW COMPARISON:  03/24/2022 FINDINGS: Cardiac shadow is stable. The lungs are well aerated bilaterally. Elevation the right hemidiaphragm is noted. Mild right basilar atelectasis is seen. No effusion is noted. No pneumothorax is noted. IMPRESSION: Mild right basilar atelectasis. Electronically Signed   By: Alcide Clever M.D.   On: 01/20/2023 20:37   (Echo, Carotid, EGD, Colonoscopy, ERCP)    Subjective: Pt c/o intermittent cough    Discharge Exam: Vitals:   01/23/23 0546 01/23/23 0727  BP: (!) 160/77 (!) 140/79  Pulse: 76 69  Resp: 17 15  Temp: 98.1 F (36.7 C) 97.7 F (36.5 C)  SpO2: 95% 96%   Vitals:   01/22/23 1605 01/22/23 2057 01/23/23 0546 01/23/23 0727  BP: 138/81 132/81 (!) 160/77 (!) 140/79  Pulse: 71 72 76 69  Resp: 16 15 17 15   Temp: 98.4 F (36.9 C) 98.7 F (37.1 C) 98.1 F (36.7 C) 97.7 F (36.5 C)  TempSrc:  Oral Oral Oral  SpO2: 96% 94% 95% 96%  Weight:      Height:        General: Pt is alert, awake, not in acute distress Cardiovascular:  S1/S2 +, no rubs, no gallops Respiratory: decreased breath sounds b/l  Abdominal: Soft, NT, ND, bowel sounds + Extremities: no edema, no cyanosis    The results of significant diagnostics from this hospitalization (including imaging, microbiology, ancillary and laboratory) are listed below for reference.     Microbiology: Recent Results (from the past 240 hour(s))  SARS Coronavirus 2 by RT PCR (hospital order, performed in [facility] hospital lab) *cepheid single result test* Anterior Nasal Swab     Status: None    Collection Time: 01/20/23  8:06 PM   Specimen: Anterior Nasal Swab  Result Value Ref Range Status   SARS Coronavirus 2 by RT PCR NEGATIVE NEGATIVE Final    Comment: (NOTE) SARS-CoV-2 target nucleic acids are NOT DETECTED.  The SARS-CoV-2 RNA is generally detectable in upper and lower respiratory specimens during the acute phase of infection. The lowest concentration of SARS-CoV-2 viral copies this assay can detect is 250 copies / mL. A negative result does not preclude SARS-CoV-2 infection and should not be used as the sole basis for treatment or other patient management decisions.  A negative result may occur with improper specimen collection / handling, submission of specimen other than nasopharyngeal swab, presence of viral mutation(s) within the areas targeted by this assay, and inadequate number of viral copies (<250 copies / mL). A negative result must be combined with clinical observations, patient history, and epidemiological information.  Fact Sheet for Patients:   RoadLapTop.co.za  Fact Sheet for Healthcare Providers: http://kim-miller.com/  This test is not  yet approved or  cleared by the [place] and has been authorized for detection and/or diagnosis of SARS-CoV-2 by FDA under an Emergency Use Authorization (EUA).  This EUA will remain in effect (meaning this test can be used) for the duration of the COVID-19 declaration under Section 564(b)(1) of the Act, 21 U.S.C. section 360bbb-3(b)(1), unless the authorization is terminated or revoked sooner.  Performed at [facility], [address] [place], [place] [address]   Culture, blood (Routine X 2) w Reflex to ID Panel     Status: None (Preliminary result)   Collection Time: 01/21/23  7:26 AM   Specimen: BLOOD LEFT ARM  Result Value Ref Range Status   Specimen Description BLOOD LEFT ARM  Final   Special Requests   Final    BOTTLES DRAWN AEROBIC AND  ANAEROBIC Blood Culture adequate volume   Culture   Final    NO GROWTH 2 DAYS Performed at [facility], [address] [place], [place] [address]    Report Status PENDING  Incomplete  Culture, blood (Routine X 2) w Reflex to ID Panel     Status: None (Preliminary result)   Collection Time: 01/21/23  7:26 AM   Specimen: BLOOD RIGHT ARM  Result Value Ref Range Status   Specimen Description BLOOD RIGHT ARM  Final   Special Requests   Final    BOTTLES DRAWN AEROBIC AND ANAEROBIC Blood Culture results may not be optimal due to an excessive volume of blood received in culture bottles   Culture   Final    NO GROWTH 2 DAYS Performed at [facility], [address] [place], [place] [address]    Report Status PENDING  Incomplete     Labs: BNP (last 3 results) Recent Labs    03/26/22 0348 01/20/23 2006  BNP 126.8* 111.0*   Basic Metabolic Panel: Recent Labs  Lab 01/20/23 2005 01/22/23 0528 01/23/23 0417  NA 131* 131* 132*  K 3.8 3.9 3.8  CL 96* 95* 96*  CO2 25 25 27   GLUCOSE 160* 102* 108*  BUN 24* 24* 25*  CREATININE 1.41* 1.16 1.08  CALCIUM 8.7* 8.7* 8.8*   Liver Function Tests: No results for input(s): "AST", "ALT", "ALKPHOS", "BILITOT", "PROT", "ALBUMIN" in the last 168 hours. No results for input(s): "LIPASE", "AMYLASE" in the last 168 hours. No results for input(s): "AMMONIA" in the last 168 hours. CBC: Recent Labs  Lab 01/20/23 2005 01/22/23 0528 01/23/23 0417  WBC 10.6* 7.0 6.1  HGB 13.3 12.5* 12.4*  HCT 40.2 37.7* 37.5*  MCV 91.6 90.6 90.4  PLT 196 169 179   Cardiac Enzymes: No results for input(s): "CKTOTAL", "CKMB", "CKMBINDEX", "TROPONINI" in the last 168 hours. BNP: Invalid input(s): "POCBNP" CBG: No results for input(s): "GLUCAP" in the last 168 hours. D-Dimer No results for input(s): "DDIMER" in the last 72 hours. Hgb A1c No results for input(s): "HGBA1C" in the last 72 hours. Lipid Profile No results for input(s):  "CHOL", "HDL", "LDLCALC", "TRIG", "CHOLHDL", "LDLDIRECT" in the last 72 hours. Thyroid function studies No results for input(s): "TSH", "T4TOTAL", "T3FREE", "THYROIDAB" in the last 72 hours.  Invalid input(s): "FREET3" Anemia work up No results for input(s): "VITAMINB12", "FOLATE", "FERRITIN", "TIBC", "IRON", "RETICCTPCT" in the last 72 hours. Urinalysis    Component Value Date/Time   COLORURINE STRAW (A) 01/21/2023 0337   APPEARANCEUR CLEAR (A) 01/21/2023 0337   LABSPEC 1.021 01/21/2023 0337   PHURINE 6.0 01/21/2023 0337   GLUCOSEU NEGATIVE 01/21/2023 9147  HGBUR NEGATIVE 01/21/2023 0337   BILIRUBINUR NEGATIVE 01/21/2023 0337   KETONESUR NEGATIVE 01/21/2023 0337   PROTEINUR NEGATIVE 01/21/2023 0337   NITRITE NEGATIVE 01/21/2023 0337   LEUKOCYTESUR NEGATIVE 01/21/2023 0337   Sepsis Labs Recent Labs  Lab 01/20/23 2005 01/22/23 0528 01/23/23 0417  WBC 10.6* 7.0 6.1   Microbiology Recent Results (from the past 240 hour(s))  SARS Coronavirus 2 by RT PCR (hospital order, performed in [facility] hospital lab) *cepheid single result test* Anterior Nasal Swab     Status: None   Collection Time: 01/20/23  8:06 PM   Specimen: Anterior Nasal Swab  Result Value Ref Range Status   SARS Coronavirus 2 by RT PCR NEGATIVE NEGATIVE Final    Comment: (NOTE) SARS-CoV-2 target nucleic acids are NOT DETECTED.  The SARS-CoV-2 RNA is generally detectable in upper and lower respiratory specimens during the acute phase of infection. The lowest concentration of SARS-CoV-2 viral copies this assay can detect is 250 copies / mL. A negative result does not preclude SARS-CoV-2 infection and should not be used as the sole basis for treatment or other patient management decisions.  A negative result may occur with improper specimen collection / handling, submission of specimen other than nasopharyngeal swab, presence of viral mutation(s) within the areas targeted by this assay, and inadequate number  of viral copies (<250 copies / mL). A negative result must be combined with clinical observations, patient history, and epidemiological information.  Fact Sheet for Patients:   RoadLapTop.co.za  Fact Sheet for Healthcare Providers: http://kim-miller.com/  This test is not yet approved or  cleared by the [place] FDA and has been authorized for detection and/or diagnosis of SARS-CoV-2 by FDA under an Emergency Use Authorization (EUA).  This EUA will remain in effect (meaning this test can be used) for the duration of the COVID-19 declaration under Section 564(b)(1) of the Act, 21 U.S.C. section 360bbb-3(b)(1), unless the authorization is terminated or revoked sooner.  Performed at [facility], [address] [place], [place] [address]   Culture, blood (Routine X 2) w Reflex to ID Panel     Status: None (Preliminary result)   Collection Time: 01/21/23  7:26 AM   Specimen: BLOOD LEFT ARM  Result Value Ref Range Status   Specimen Description BLOOD LEFT ARM  Final   Special Requests   Final    BOTTLES DRAWN AEROBIC AND ANAEROBIC Blood Culture adequate volume   Culture   Final    NO GROWTH 2 DAYS Performed at [facility], [address] [place], [place] [address]    Report Status PENDING  Incomplete  Culture, blood (Routine X 2) w Reflex to ID Panel     Status: None (Preliminary result)   Collection Time: 01/21/23  7:26 AM   Specimen: BLOOD RIGHT ARM  Result Value Ref Range Status   Specimen Description BLOOD RIGHT ARM  Final   Special Requests   Final    BOTTLES DRAWN AEROBIC AND ANAEROBIC Blood Culture results may not be optimal due to an excessive volume of blood received in culture bottles   Culture   Final    NO GROWTH 2 DAYS Performed at [facility], [address] [place], [place] [address]    Report Status PENDING  Incomplete     Time coordinating discharge: Over 30  minutes  SIGNED:   Charise Killian, MD  [facility] 01/23/2023, 12:29 PM Pager   If 7PM-7AM, please contact night-coverage [email or web address]

## 2023-01-26 LAB — CULTURE, BLOOD (ROUTINE X 2)
Culture: NO GROWTH
Culture: NO GROWTH

## 2023-12-09 IMAGING — DX DG HAND COMPLETE 3+V*R*
3 series · 3 of 3 positions shown · non-contrast
Comparison: Film from earlier in the same day.

CLINICAL DATA: Status post reduction

EXAM:
RIGHT HAND - COMPLETE 3+ VIEW

[hand ap]
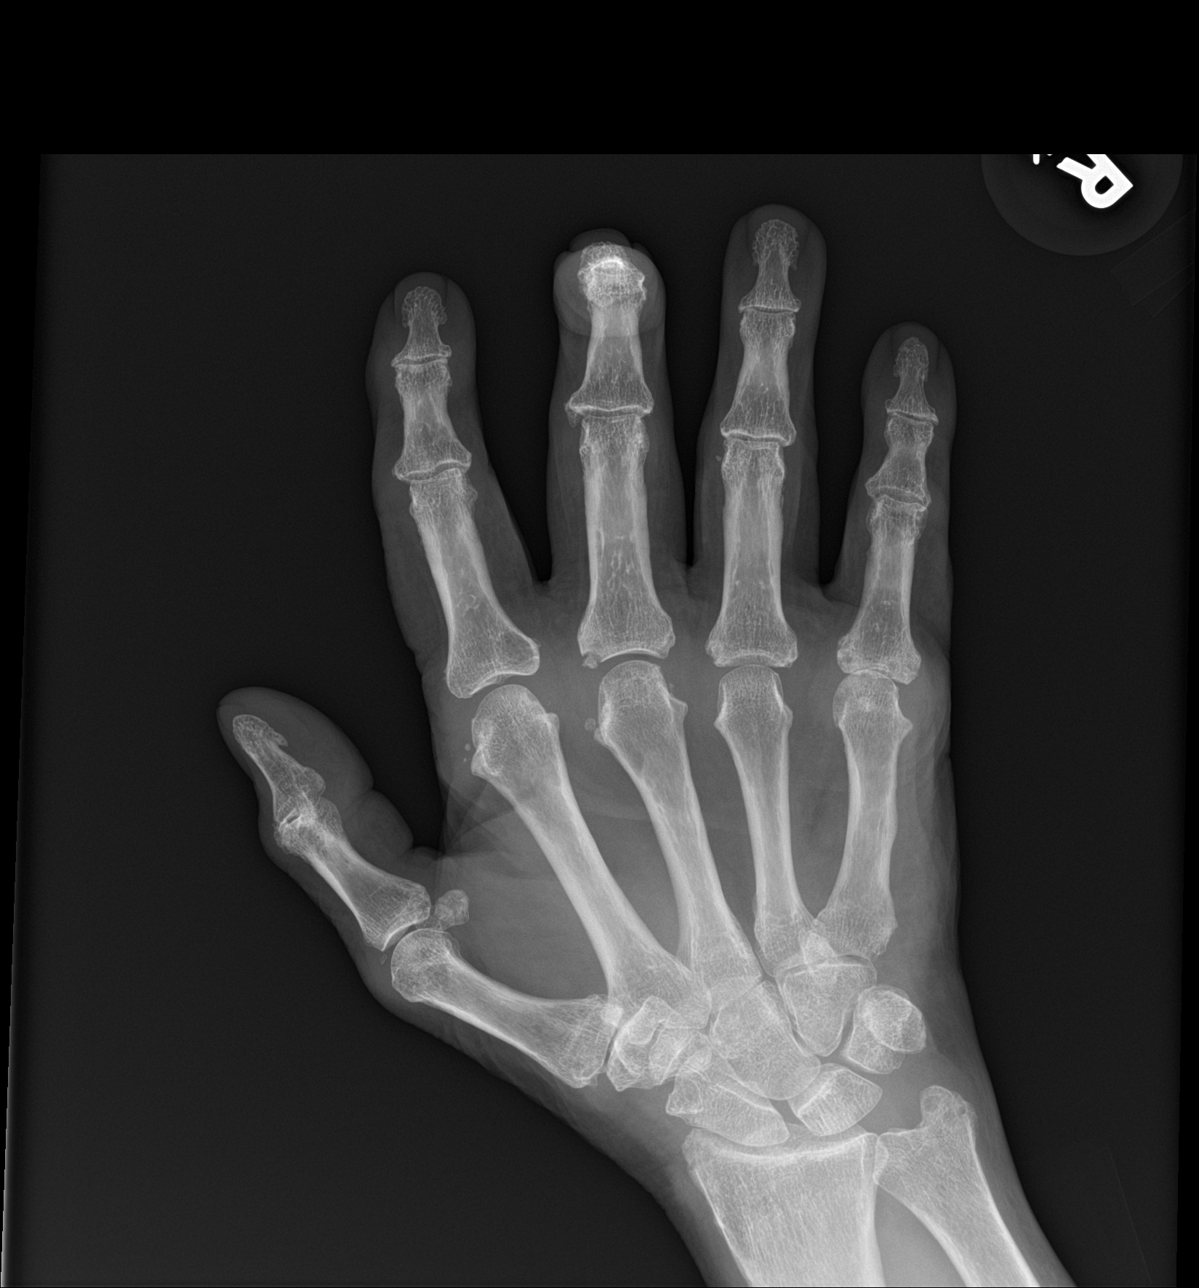

[hand obl]
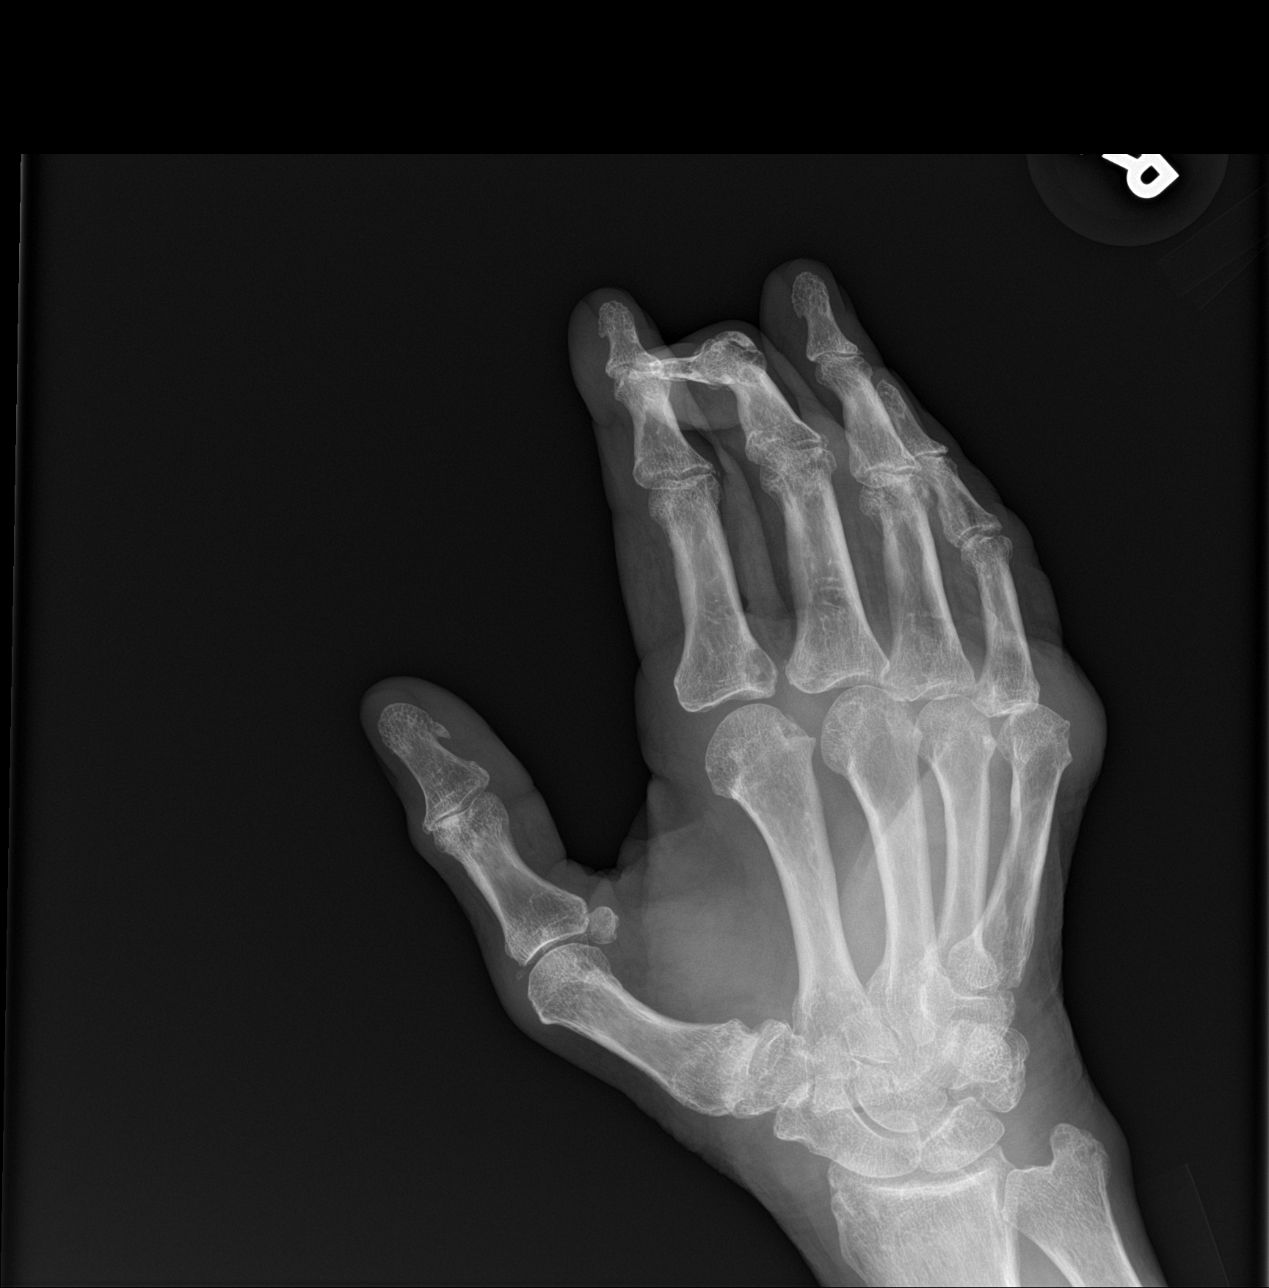

[hand lat]
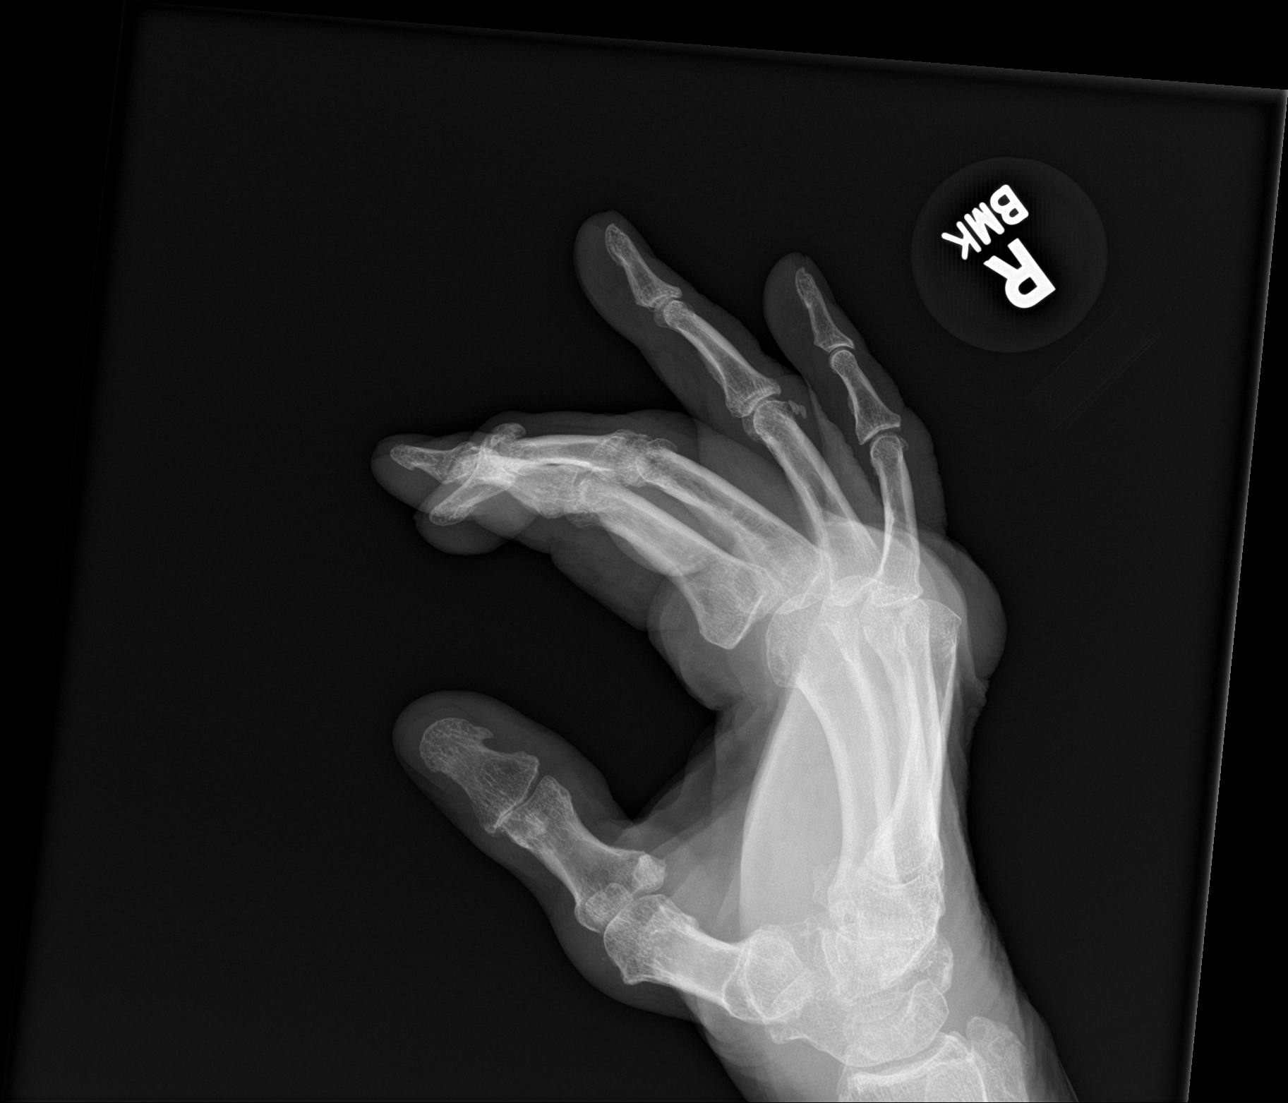

[3 of 3 positions shown; findings below may reference images not displayed]

FINDINGS: Fracture is again noted at the base of the third proximal phalanx
stable in appearance from the prior exam. There remains
interphalangeal degenerative changes as well as soft tissue swelling
over the fifth MCP joint with mild volar subluxation of the proximal
phalanx with respect to the metacarpal head. No other focal
abnormality is seen.
IMPRESSION: Stable fracture at the base of the third proximal phalanx.

The remainder of the exam is unchanged.

## 2024-02-11 ENCOUNTER — Encounter: Payer: Self-pay | Admitting: Ophthalmology

## 2024-02-11 NOTE — Anesthesia Preprocedure Evaluation (Addendum)
 Anesthesia Evaluation  Patient identified by MRN, date of birth, ID band Patient awake    Reviewed: Allergy & Precautions, H&P , NPO status , Patient's Chart, lab work & pertinent test results  Airway Mallampati: III  TM Distance: >3 FB Neck ROM: Full    Dental no notable dental hx. (+) Poor Dentition, Chipped, Loose, Missing Loose lower left lateral incisor, possibly other loose teeth:   Pulmonary neg pulmonary ROS   Pulmonary exam normal breath sounds clear to auscultation       Cardiovascular hypertension, negative cardio ROS Normal cardiovascular exam Rhythm:Regular Rate:Normal     Neuro/Psych CVA negative neurological ROS  negative psych ROS   GI/Hepatic negative GI ROS, Neg liver ROS,,,  Endo/Other  negative endocrine ROS    Renal/GU Renal diseasenegative Renal ROS  negative genitourinary   Musculoskeletal negative musculoskeletal ROS (+) Arthritis ,    Abdominal   Peds negative pediatric ROS (+)  Hematology negative hematology ROS (+)   Anesthesia Other Findings  Hypertension  Arthritis Stroke   Stiff neck Wears hearing aid in both ears     Reproductive/Obstetrics negative OB ROS                             Anesthesia Physical Anesthesia Plan  ASA: 3  Anesthesia Plan: MAC   Post-op Pain Management:    Induction: Intravenous  PONV Risk Score and Plan:   Airway Management Planned: Natural Airway and Nasal Cannula  Additional Equipment:   Intra-op Plan:   Post-operative Plan:   Informed Consent: I have reviewed the patients History and Physical, chart, labs and discussed the procedure including the risks, benefits and alternatives for the proposed anesthesia with the patient or authorized representative who has indicated his/her understanding and acceptance.     Dental Advisory Given  Plan Discussed with: Anesthesiologist, CRNA and Surgeon  Anesthesia Plan  Comments: (Patient consented for risks of anesthesia including but not limited to:  - adverse reactions to medications - damage to eyes, teeth, lips or other oral mucosa - nerve damage due to positioning  - sore throat or hoarseness - Damage to heart, brain, nerves, lungs, other parts of body or loss of life  Patient voiced understanding and assent.)       Anesthesia Quick Evaluation

## 2024-02-19 NOTE — Discharge Instructions (Signed)

## 2024-02-22 ENCOUNTER — Ambulatory Visit: Payer: Self-pay | Admitting: Anesthesiology

## 2024-02-22 ENCOUNTER — Ambulatory Visit
Admission: RE | Admit: 2024-02-22 | Discharge: 2024-02-22 | Disposition: A | Discharge status: Home / Self Care | Attending: Ophthalmology | Admitting: Ophthalmology

## 2024-02-22 ENCOUNTER — Other Ambulatory Visit: Payer: Self-pay

## 2024-02-22 ENCOUNTER — Encounter
Admission: RE | Disposition: A | Discharge status: Home / Self Care | Payer: Self-pay | Source: Home / Self Care | Attending: Ophthalmology

## 2024-02-22 ENCOUNTER — Encounter: Payer: Self-pay | Admitting: Ophthalmology

## 2024-02-22 DIAGNOSIS — I10 Essential (primary) hypertension: Secondary | ICD-10-CM | POA: Insufficient documentation

## 2024-02-22 DIAGNOSIS — H2512 Age-related nuclear cataract, left eye: Secondary | ICD-10-CM | POA: Diagnosis present

## 2024-02-22 HISTORY — DX: Unspecified osteoarthritis, unspecified site: M19.90

## 2024-02-22 HISTORY — DX: Torticollis: M43.6

## 2024-02-22 HISTORY — DX: Presence of external hearing-aid: Z97.4

## 2024-02-22 HISTORY — PX: CATARACT EXTRACTION W/PHACO: SHX586

## 2024-02-22 SURGERY — PHACOEMULSIFICATION, CATARACT, WITH IOL INSERTION
Anesthesia: Monitor Anesthesia Care | Site: Eye | Laterality: Left

## 2024-02-22 MED ORDER — SIGHTPATH DOSE#1 BSS IO SOLN
INTRAOCULAR | Status: DC | PRN
  Administered 2024-02-22: 102 mL via OPHTHALMIC

## 2024-02-22 MED ORDER — MOXIFLOXACIN HCL 0.5 % OP SOLN
OPHTHALMIC | Status: DC | PRN
  Administered 2024-02-22: .2 mL via OPHTHALMIC

## 2024-02-22 MED ORDER — FENTANYL CITRATE (PF) 100 MCG/2ML IJ SOLN
INTRAMUSCULAR | Status: AC
  Filled 2024-02-22: qty 2

## 2024-02-22 MED ORDER — FENTANYL CITRATE (PF) 100 MCG/2ML IJ SOLN
INTRAMUSCULAR | Status: DC | PRN
  Administered 2024-02-22: 25 ug via INTRAVENOUS

## 2024-02-22 MED ORDER — LACTATED RINGERS IV SOLN: INTRAVENOUS | Status: DC

## 2024-02-22 MED ORDER — ARMC OPHTHALMIC DILATING DROPS
1.0000 | OPHTHALMIC | Status: DC | PRN
  Administered 2024-02-22 (×3): 1 via OPHTHALMIC

## 2024-02-22 MED ORDER — TETRACAINE HCL 0.5 % OP SOLN
1.0000 [drp] | OPHTHALMIC | Status: DC | PRN
  Administered 2024-02-22 (×3): 1 [drp] via OPHTHALMIC

## 2024-02-22 MED ORDER — SIGHTPATH DOSE#1 NA HYALUR & NA CHOND-NA HYALUR IO KIT
PACK | INTRAOCULAR | Status: DC | PRN
  Administered 2024-02-22: 1 via OPHTHALMIC

## 2024-02-22 MED ORDER — TETRACAINE HCL 0.5 % OP SOLN
OPHTHALMIC | Status: AC
Start: 2024-02-22 — End: ?
  Filled 2024-02-22: qty 4

## 2024-02-22 MED ORDER — ARMC OPHTHALMIC DILATING DROPS
OPHTHALMIC | Status: AC
Start: 2024-02-22 — End: ?
  Filled 2024-02-22: qty 0.5

## 2024-02-22 MED ORDER — LIDOCAINE HCL (PF) 2 % IJ SOLN
INTRAOCULAR | Status: DC | PRN
  Administered 2024-02-22: 4 mL via INTRAOCULAR

## 2024-02-22 MED ORDER — SIGHTPATH DOSE#1 BSS IO SOLN
INTRAOCULAR | Status: DC | PRN
  Administered 2024-02-22: 15 mL via INTRAOCULAR

## 2024-02-22 SURGICAL SUPPLY — 12 items
CATARACT SUITE SIGHTPATH (MISCELLANEOUS) ×1 IMPLANT
DISSECTOR HYDRO NUCLEUS 50X22 (MISCELLANEOUS) ×1 IMPLANT
FEE CATARACT SUITE SIGHTPATH (MISCELLANEOUS) ×1 IMPLANT
GLOVE PI ULTRA LF STRL 7.5 (GLOVE) ×1 IMPLANT
GLOVE SURG POLYISOPRENE 8.5 (GLOVE) ×1 IMPLANT
GLOVE SURG PROTEXIS BL SZ6.5 (GLOVE) ×1 IMPLANT
GLOVE SURG SYN 6.5 PF PI BL (GLOVE) ×1 IMPLANT
GLOVE SURG SYN 8.5 PF PI BL (GLOVE) ×1 IMPLANT
LENS IOL TECNIS EYHANCE 18.5 (Intraocular Lens) IMPLANT
NDL FILTER BLUNT 18X1 1/2 (NEEDLE) ×1 IMPLANT
NEEDLE FILTER BLUNT 18X1 1/2 (NEEDLE) ×1 IMPLANT
SYR 3ML LL SCALE MARK (SYRINGE) ×1 IMPLANT

## 2024-02-22 NOTE — H&P (Signed)
 [facility]   Primary Care Physician:  Jannetta Men Ophthalmologist: Dr. Dusty Gin  Pre-Procedure History & Physical: HPI:  Jose Porter is a 89 y.o. male here for cataract surgery.   Past Medical History:  Diagnosis Date   Arthritis    Hypertension    Stiff neck    limited neck mobility . Difficulty with up and down motion   Stroke [facility]) 2012   "mild" - no deficits   Wears hearing aid in both ears     Past Surgical History:  Procedure Laterality Date   HERNIA REPAIR      Prior to Admission medications   Medication Sig Start Date End Date Taking? Authorizing Provider  aspirin  EC 81 MG tablet Take 81 mg by mouth daily. Swallow whole.   Yes [provider]  carvedilol  (COREG ) 3.125 MG tablet Take 3.125 mg by mouth 2 (two) times daily. 03/07/22  Yes [provider]  Cholecalciferol  (VITAMIN D3) 125 MCG (5000 UT) TABS Take by mouth daily.   Yes [provider]  finasteride  (PROSCAR ) 5 MG tablet Take 5 mg by mouth daily. 02/27/22  Yes [provider]  hydrochlorothiazide (HYDRODIURIL) 25 MG tablet Take 25 mg by mouth daily. 01/27/22  Yes [provider]  Multiple Vitamins-Minerals (PRESERVISION AREDS 2 PO) Take by mouth.   Yes [provider]  simvastatin  (ZOCOR ) 20 MG tablet Take 20 mg by mouth at bedtime. 03/03/22  Yes [provider]  vitamin B-12 (CYANOCOBALAMIN ) 1000 MCG tablet Take 500 mcg by mouth daily.   Yes [provider]  POTASSIUM PHOSPHATE PO Take 1 tablet by mouth daily. Patient not taking: Reported on 02/11/2024    [provider]    Allergies as of 01/19/2024 - Review Complete 01/21/2023  Allergen Reaction Noted   Lisinopril Swelling 02/05/2015    History reviewed. No pertinent family history.  Social History   Socioeconomic History   Marital status: Married    Spouse name: Not on file   Number of children: Not on file   Years of education: Not on file    Highest education level: Not on file  Occupational History   Not on file  Tobacco Use   Smoking status: Never   Smokeless tobacco: Never  Vaping Use   Vaping status: Never Used  Substance and Sexual Activity   Alcohol use: No   Drug use: No   Sexual activity: Not on file  Other Topics Concern   Not on file  Social History Narrative   Not on file   Social Drivers of Health   Financial Resource Strain: Low Risk  (09/09/2023)   Received from [facility]   Overall Financial Resource Strain (CARDIA)    Difficulty of Paying Living Expenses: Not hard at all  Food Insecurity: Low Risk  (12/28/2023)   Received from [facility]   Hunger Vital Sign    Worried About Running Out of Food in the Last Year: Never true    Ran Out of Food in the Last Year: Never true  Transportation Needs: No Transportation Needs (12/28/2023)   Received from [employer]    In the past 12 months, has lack of reliable transportation kept you from medical appointments, meetings, work or from getting things needed for daily living? : No  Physical Activity: Not on file  Stress: Not on file  Social Connections: Not on file  Intimate Partner Violence: Not At Risk (01/21/2023)   Humiliation, Afraid,  Rape, and Kick questionnaire    Fear of Current or Ex-Partner: No    Emotionally Abused: No    Physically Abused: No    Sexually Abused: No    Review of Systems: See HPI, otherwise negative ROS  Physical Exam: BP 131/68   Temp 98.4 F (36.9 C) (Temporal)   Resp 11   Ht 5\' 10"  (1.778 m)   Wt 90.9 kg   SpO2 95%   BMI 28.77 kg/m  General:   Alert, cooperative. Head:  Normocephalic and atraumatic. Respiratory:  Normal work of breathing. Cardiovascular:  NAD  Impression/Plan: Jose Porter is here for cataract surgery.  Risks, benefits, limitations, and alternatives regarding cataract surgery have been reviewed with the patient.  Questions have been answered.  All  parties agreeable.   Dusty Gin, MD  02/22/2024, 10:50 AM

## 2024-02-22 NOTE — Anesthesia Postprocedure Evaluation (Signed)
 Anesthesia Post Note  Patient: Jose Porter  Procedure(s) Performed: PHACOEMULSIFICATION, CATARACT, WITH IOL INSERTION 18.95 01:28.7 (Left: Eye)  Patient location during evaluation: PACU Anesthesia Type: MAC Level of consciousness: awake and alert Pain management: pain level controlled Vital Signs Assessment: post-procedure vital signs reviewed and stable Respiratory status: spontaneous breathing, nonlabored ventilation, respiratory function stable and patient connected to nasal cannula oxygen Cardiovascular status: stable and blood pressure returned to baseline Postop Assessment: no apparent nausea or vomiting Anesthetic complications: no   No notable events documented.   Last Vitals:  Vitals:   02/22/24 1124 02/22/24 1130  BP:  128/73  Pulse: 63 61  Resp: 15 16  Temp: (!) 36.2 C (!) 36.2 C  SpO2: 97% 95%    Last Pain:  Vitals:   02/22/24 1130  TempSrc:   PainSc: 0-No pain                 Jessye Imhoff C Zephyra Bernardi

## 2024-02-22 NOTE — Op Note (Signed)
 OPERATIVE NOTE  Jose Porter [id number] 02/22/2024   PREOPERATIVE DIAGNOSIS:  Nuclear sclerotic cataract left eye.  H25.12   POSTOPERATIVE DIAGNOSIS:    Nuclear sclerotic cataract left eye.     PROCEDURE:  Phacoemusification with posterior chamber intraocular lens placement of the left eye   LENS:   Implant Name Type Inv. Item Serial No. Manufacturer Lot No. LRB No. Used Action  LENS IOL TECNIS EYHANCE 18.5 - [id number] Intraocular Lens LENS IOL TECNIS EYHANCE 18.5 [id number] [employer]  Left 1 Implanted      Procedure(s): PHACOEMULSIFICATION, CATARACT, WITH IOL INSERTION 18.95 01:28.7 (Left)  SURGEON:  Dusty Gin, MD, MPH   ANESTHESIA:  Topical with tetracaine drops augmented with 1% preservative-free intracameral lidocaine.  ESTIMATED BLOOD LOSS: <1 mL   COMPLICATIONS:  None.   DESCRIPTION OF PROCEDURE:  The patient was identified in the holding room and transported to the operating room and placed in the supine position under the operating microscope.  The left eye was identified as the operative eye and it was prepped and draped in the usual sterile ophthalmic fashion.   A 1.0 millimeter clear-corneal paracentesis was made at the 5:00 position. 0.5 ml of preservative-free 1% lidocaine with epinephrine was injected into the anterior chamber.  The anterior chamber was filled with viscoelastic.  A 2.4 millimeter keratome was used to make a near-clear corneal incision at the 2:00 position.  A curvilinear capsulorrhexis was made with a cystotome and capsulorrhexis forceps.  Balanced salt solution was used to hydrodissect and hydrodelineate the nucleus.   Phacoemulsification was then used in stop and chop fashion to remove the lens nucleus and epinucleus.  The remaining cortex was then removed using the irrigation and aspiration handpiece. Viscoelastic was then placed into the capsular bag to distend it for lens placement.  A lens was then injected into the capsular bag.  The  remaining viscoelastic was aspirated.   Wounds were hydrated with balanced salt solution.  The anterior chamber was inflated to a physiologic pressure with balanced salt solution.  Intracameral vigamox 0.1 mL undiltued was injected into the eye and a drop placed onto the ocular surface.  No wound leaks were noted.  The patient was taken to the recovery room in stable condition without complications of anesthesia or surgery  Dusty Gin 02/22/2024, 11:21 AM

## 2024-02-22 NOTE — Transfer of Care (Signed)
 Immediate Anesthesia Transfer of Care Note  Patient: Jose Porter  Procedure(s) Performed: PHACOEMULSIFICATION, CATARACT, WITH IOL INSERTION 18.95 01:28.7 (Left: Eye)  Patient Location: PACU  Anesthesia Type: MAC  Level of Consciousness: awake, alert  and patient cooperative  Airway and Oxygen Therapy: Patient Spontanous Breathing and Patient connected to supplemental oxygen  Post-op Assessment: Post-op Vital signs reviewed, Patient's Cardiovascular Status Stable, Respiratory Function Stable, Patent Airway and No signs of Nausea or vomiting  Post-op Vital Signs: Reviewed and stable  Complications: No notable events documented.

## 2024-03-11 NOTE — Discharge Instructions (Signed)

## 2024-03-14 ENCOUNTER — Encounter
Admission: RE | Disposition: A | Discharge status: Home / Self Care | Payer: Self-pay | Source: Home / Self Care | Attending: Ophthalmology

## 2024-03-14 ENCOUNTER — Encounter: Payer: Self-pay | Admitting: Ophthalmology

## 2024-03-14 ENCOUNTER — Ambulatory Visit: Payer: Self-pay | Admitting: Anesthesiology

## 2024-03-14 ENCOUNTER — Ambulatory Visit
Admission: RE | Admit: 2024-03-14 | Discharge: 2024-03-14 | Disposition: A | Discharge status: Home / Self Care | Attending: Ophthalmology | Admitting: Ophthalmology

## 2024-03-14 ENCOUNTER — Other Ambulatory Visit: Payer: Self-pay

## 2024-03-14 DIAGNOSIS — Z8673 Personal history of transient ischemic attack (TIA), and cerebral infarction without residual deficits: Secondary | ICD-10-CM | POA: Diagnosis not present

## 2024-03-14 DIAGNOSIS — H2511 Age-related nuclear cataract, right eye: Secondary | ICD-10-CM | POA: Diagnosis present

## 2024-03-14 DIAGNOSIS — N189 Chronic kidney disease, unspecified: Secondary | ICD-10-CM | POA: Diagnosis not present

## 2024-03-14 DIAGNOSIS — I129 Hypertensive chronic kidney disease with stage 1 through stage 4 chronic kidney disease, or unspecified chronic kidney disease: Secondary | ICD-10-CM | POA: Diagnosis not present

## 2024-03-14 DIAGNOSIS — Z79899 Other long term (current) drug therapy: Secondary | ICD-10-CM | POA: Insufficient documentation

## 2024-03-14 HISTORY — PX: CATARACT EXTRACTION W/PHACO: SHX586

## 2024-03-14 SURGERY — PHACOEMULSIFICATION, CATARACT, WITH IOL INSERTION
Anesthesia: Monitor Anesthesia Care | Site: Eye | Laterality: Right

## 2024-03-14 MED ORDER — DROPERIDOL 2.5 MG/ML IJ SOLN: 0.6250 mg | Freq: Once | INTRAMUSCULAR | Status: DC | PRN

## 2024-03-14 MED ORDER — MIDAZOLAM HCL 2 MG/2ML IJ SOLN
INTRAMUSCULAR | Status: DC | PRN
  Administered 2024-03-14: .5 mg via INTRAVENOUS

## 2024-03-14 MED ORDER — FENTANYL CITRATE (PF) 100 MCG/2ML IJ SOLN
INTRAMUSCULAR | Status: AC
  Filled 2024-03-14: qty 2

## 2024-03-14 MED ORDER — LACTATED RINGERS IV SOLN: INTRAVENOUS | Status: DC

## 2024-03-14 MED ORDER — ACETAMINOPHEN 10 MG/ML IV SOLN: 1000.0000 mg | Freq: Once | INTRAVENOUS | Status: DC | PRN

## 2024-03-14 MED ORDER — OXYCODONE HCL 5 MG PO TABS: 5.0000 mg | ORAL_TABLET | Freq: Once | ORAL | Status: DC | PRN

## 2024-03-14 MED ORDER — OXYCODONE HCL 5 MG/5ML PO SOLN: 5.0000 mg | Freq: Once | ORAL | Status: DC | PRN

## 2024-03-14 MED ORDER — SODIUM CHLORIDE 0.9% FLUSH
INTRAVENOUS | Status: DC | PRN
  Administered 2024-03-14: 10 mL via INTRAVENOUS

## 2024-03-14 MED ORDER — FENTANYL CITRATE PF 50 MCG/ML IJ SOSY: 25.0000 ug | PREFILLED_SYRINGE | INTRAMUSCULAR | Status: DC | PRN

## 2024-03-14 MED ORDER — SIGHTPATH DOSE#1 BSS IO SOLN
INTRAOCULAR | Status: DC | PRN
  Administered 2024-03-14: 15 mL via INTRAOCULAR

## 2024-03-14 MED ORDER — TETRACAINE HCL 0.5 % OP SOLN
OPHTHALMIC | Status: AC
  Filled 2024-03-14: qty 4

## 2024-03-14 MED ORDER — SIGHTPATH DOSE#1 BSS IO SOLN
INTRAOCULAR | Status: DC | PRN
  Administered 2024-03-14: 99 mL via OPHTHALMIC

## 2024-03-14 MED ORDER — MOXIFLOXACIN HCL 0.5 % OP SOLN
OPHTHALMIC | Status: DC | PRN
  Administered 2024-03-14: .2 mL via OPHTHALMIC

## 2024-03-14 MED ORDER — MIDAZOLAM HCL 2 MG/2ML IJ SOLN
INTRAMUSCULAR | Status: AC
  Filled 2024-03-14: qty 2

## 2024-03-14 MED ORDER — ARMC OPHTHALMIC DILATING DROPS
OPHTHALMIC | Status: AC
Start: 2024-03-14 — End: 2024-03-14
  Filled 2024-03-14: qty 0.5

## 2024-03-14 MED ORDER — ARMC OPHTHALMIC DILATING DROPS
1.0000 | OPHTHALMIC | Status: DC | PRN
  Administered 2024-03-14 (×3): 1 via OPHTHALMIC

## 2024-03-14 MED ORDER — LIDOCAINE HCL (PF) 2 % IJ SOLN
INTRAOCULAR | Status: DC | PRN
  Administered 2024-03-14: 4 mL via INTRAOCULAR

## 2024-03-14 MED ORDER — TETRACAINE HCL 0.5 % OP SOLN
1.0000 [drp] | OPHTHALMIC | Status: DC | PRN
  Administered 2024-03-14 (×3): 1 [drp] via OPHTHALMIC

## 2024-03-14 MED ORDER — SIGHTPATH DOSE#1 NA HYALUR & NA CHOND-NA HYALUR IO KIT
PACK | INTRAOCULAR | Status: DC | PRN
  Administered 2024-03-14: 1 via OPHTHALMIC

## 2024-03-14 SURGICAL SUPPLY — 12 items
CATARACT SUITE SIGHTPATH (MISCELLANEOUS) ×1 IMPLANT
DISSECTOR HYDRO NUCLEUS 50X22 (MISCELLANEOUS) ×1 IMPLANT
FEE CATARACT SUITE SIGHTPATH (MISCELLANEOUS) ×1 IMPLANT
GLOVE PI ULTRA LF STRL 7.5 (GLOVE) ×1 IMPLANT
GLOVE SURG POLYISOPRENE 8.5 (GLOVE) ×1 IMPLANT
GLOVE SURG PROTEXIS BL SZ6.5 (GLOVE) ×1 IMPLANT
GLOVE SURG SYN 6.5 PF PI BL (GLOVE) ×1 IMPLANT
GLOVE SURG SYN 8.5 PF PI BL (GLOVE) ×1 IMPLANT
LENS IOL TECNIS EYHANCE 19.0 (Intraocular Lens) IMPLANT
NDL FILTER BLUNT 18X1 1/2 (NEEDLE) ×1 IMPLANT
NEEDLE FILTER BLUNT 18X1 1/2 (NEEDLE) ×1 IMPLANT
SYR 3ML LL SCALE MARK (SYRINGE) ×1 IMPLANT

## 2024-03-14 NOTE — Anesthesia Postprocedure Evaluation (Signed)
 Anesthesia Post Note  Patient: [name]  Procedure(s) Performed: PHACOEMULSIFICATION, CATARACT, WITH IOL INSERTION 20.75 01:38.0 (Right: Eye)  Patient location during evaluation: PACU Anesthesia Type: MAC Level of consciousness: awake and alert Pain management: pain level controlled Vital Signs Assessment: post-procedure vital signs reviewed and stable Respiratory status: spontaneous breathing, nonlabored ventilation, respiratory function stable and patient connected to nasal cannula oxygen Cardiovascular status: stable and blood pressure returned to baseline Postop Assessment: no apparent nausea or vomiting Anesthetic complications: no   No notable events documented.   Last Vitals:  Vitals:   03/14/24 1103 03/14/24 1109  BP: (!) 140/76 138/76  Pulse: 61 60  Resp: 16 19  Temp: (!) 36.1 C (!) 36.1 C  SpO2: 99% 96%    Last Pain:  Vitals:   03/14/24 1109  TempSrc:   PainSc: 0-No pain                 Lynwood KANDICE Clause

## 2024-03-14 NOTE — H&P (Signed)
 [facility]   Primary Care Physician:  Beecher Mitzie Dragon, AGNP-C Ophthalmologist: Dr. Adine Novak  Pre-Procedure History & Physical: HPI:  Jose Porter is a 89 y.o. male here for cataract surgery.   Past Medical History:  Diagnosis Date   Arthritis    Hypertension    Stiff neck    limited neck mobility . Difficulty with up and down motion   Stroke [facility]) 2012   mild - no deficits   Wears hearing aid in both ears     Past Surgical History:  Procedure Laterality Date   CATARACT EXTRACTION W/PHACO Left 02/22/2024   Procedure: PHACOEMULSIFICATION, CATARACT, WITH IOL INSERTION 18.95 01:28.7;  Surgeon: Novak Adine Anes, MD;  [facility]: [facility];  Service: Ophthalmology;  Laterality: Left;   HERNIA REPAIR      Prior to Admission medications   Medication Sig Start Date End Date Taking? Authorizing Provider  aspirin  EC 81 MG tablet Take 81 mg by mouth daily. Swallow whole.   Yes [provider]  carvedilol  (COREG ) 3.125 MG tablet Take 3.125 mg by mouth 2 (two) times daily. 03/07/22  Yes [provider]  Cholecalciferol  (VITAMIN D3) 125 MCG (5000 UT) TABS Take by mouth daily.   Yes [provider]  finasteride  (PROSCAR ) 5 MG tablet Take 5 mg by mouth daily. 02/27/22  Yes [provider]  hydrochlorothiazide (HYDRODIURIL) 25 MG tablet Take 25 mg by mouth daily. 01/27/22  Yes [provider]  Multiple Vitamins-Minerals (PRESERVISION AREDS 2 PO) Take by mouth.   Yes [provider]  vitamin B-12 (CYANOCOBALAMIN ) 1000 MCG tablet Take 500 mcg by mouth daily.   Yes [provider]  POTASSIUM PHOSPHATE PO Take 1 tablet by mouth daily. Patient not taking: Reported on 02/11/2024    [provider]  simvastatin  (ZOCOR ) 20 MG tablet Take 20 mg by mouth at bedtime. 03/03/22   [provider]    Allergies as of 01/19/2024 - Review Complete 01/21/2023  Allergen Reaction Noted   Lisinopril Swelling  02/05/2015    History reviewed. No pertinent family history.  Social History   Socioeconomic History   Marital status: Married    Spouse name: Not on file   Number of children: Not on file   Years of education: Not on file   Highest education level: Not on file  Occupational History   Not on file  Tobacco Use   Smoking status: Never   Smokeless tobacco: Never  Vaping Use   Vaping status: Never Used  Substance and Sexual Activity   Alcohol use: No   Drug use: No   Sexual activity: Not on file  Other Topics Concern   Not on file  Social History Narrative   Not on file   Social Drivers of Health   Financial Resource Strain: Low Risk  (09/09/2023)   Received from [facility]   Overall Financial Resource Strain (CARDIA)    Difficulty of Paying Living Expenses: Not hard at all  Food Insecurity: Low Risk  (12/28/2023)   Received from [facility]   Hunger Vital Sign    Within the past 12 months, you worried that your food would run out before you got money to buy more: Never true    Within the past 12 months, the food you bought just didn't last and you didn't have money to get more. : Never true  Transportation Needs: No Transportation Needs (12/28/2023)   Received from [employer]  In the past 12 months, has lack of reliable transportation kept you from medical appointments, meetings, work or from getting things needed for daily living? : No  Physical Activity: Not on file  Stress: Not on file  Social Connections: Not on file  Intimate Partner Violence: Not At Risk (01/21/2023)   Humiliation, Afraid, Rape, and Kick questionnaire    Fear of Current or Ex-Partner: No    Emotionally Abused: No    Physically Abused: No    Sexually Abused: No    Review of Systems: See HPI, otherwise negative ROS  Physical Exam: BP 135/69   Temp 98.1 F (36.7 C) (Temporal)   Resp 12   Ht 5' 10 (1.778 m)   Wt 90.9 kg   SpO2 96%   BMI 28.77 kg/m   General:   Alert, cooperative. Head:  Normocephalic and atraumatic. Respiratory:  Normal work of breathing. Cardiovascular:  NAD  Impression/Plan: Jose Porter is here for cataract surgery.  Risks, benefits, limitations, and alternatives regarding cataract surgery have been reviewed with the patient.  Questions have been answered.  All parties agreeable.   Adine Novak, MD  03/14/2024, 10:32 AM

## 2024-03-14 NOTE — Anesthesia Preprocedure Evaluation (Addendum)
 Anesthesia Evaluation  Patient identified by MRN, date of birth, ID band Patient awake    Reviewed: Allergy & Precautions, H&P , NPO status , Patient's Chart, lab work & pertinent test results, reviewed documented beta blocker date and time   Airway Mallampati: II  TM Distance: >3 FB Neck ROM: full    Dental no notable dental hx. (+) Teeth Intact   Pulmonary neg pulmonary ROS   Pulmonary exam normal breath sounds clear to auscultation       Cardiovascular Exercise Tolerance: Poor hypertension, On Medications negative cardio ROS  Rhythm:regular Rate:Normal     Neuro/Psych CVA negative neurological ROS  negative psych ROS   GI/Hepatic negative GI ROS, Neg liver ROS,,,  Endo/Other  negative endocrine ROSdiabetes, Well Controlled    Renal/GU CRFRenal disease     Musculoskeletal   Abdominal   Peds  Hematology negative hematology ROS (+)   Anesthesia Other Findings   Reproductive/Obstetrics negative OB ROS                             Anesthesia Physical Anesthesia Plan  ASA: 3  Anesthesia Plan: MAC   Post-op Pain Management:    Induction:   PONV Risk Score and Plan: 1  Airway Management Planned:   Additional Equipment:   Intra-op Plan:   Post-operative Plan:   Informed Consent: I have reviewed the patients History and Physical, chart, labs and discussed the procedure including the risks, benefits and alternatives for the proposed anesthesia with the patient or authorized representative who has indicated his/her understanding and acceptance.       Plan Discussed with: CRNA  Anesthesia Plan Comments:        Anesthesia Quick Evaluation

## 2024-03-14 NOTE — Op Note (Signed)
 OPERATIVE NOTE  Jose Porter [id number] 03/14/2024   PREOPERATIVE DIAGNOSIS:  Nuclear sclerotic cataract right eye.  H25.11   POSTOPERATIVE DIAGNOSIS:    Nuclear sclerotic cataract right eye.     PROCEDURE:  Phacoemusification with posterior chamber intraocular lens placement of the right eye   LENS:   Implant Name Type Inv. Item Serial No. Manufacturer Lot No. LRB No. Used Action  LENS IOL TECNIS EYHANCE 19.0 - [id number] Intraocular Lens LENS IOL TECNIS EYHANCE 19.0 [id number] [employer]  Right 1 Implanted       Procedure(s): PHACOEMULSIFICATION, CATARACT, WITH IOL INSERTION 20.75 01:38.0 (Right)  SURGEON:  Adine Novak, MD, MPH  ANESTHESIOLOGIST: Anesthesiologist: Myra Lynwood MATSU, MD CRNA: Jarvis Lew, CRNA   ANESTHESIA:  Topical with tetracaine  drops augmented with 1% preservative-free intracameral lidocaine .  ESTIMATED BLOOD LOSS: less than 1 mL.   COMPLICATIONS:  None.   DESCRIPTION OF PROCEDURE:  The patient was identified in the holding room and transported to the operating room and placed in the supine position under the operating microscope.  The right eye was identified as the operative eye and it was prepped and draped in the usual sterile ophthalmic fashion.   A 1.0 millimeter clear-corneal paracentesis was made at the 10:30 position. 0.5 ml of preservative-free 1% lidocaine  with epinephrine  was injected into the anterior chamber.  The anterior chamber was filled with viscoelastic.  A 2.4 millimeter keratome was used to make a near-clear corneal incision at the 8:00 position.  A curvilinear capsulorrhexis was made with a cystotome and capsulorrhexis forceps.  Balanced salt  solution was used to hydrodissect and hydrodelineate the nucleus.   Phacoemulsification was then used in stop and chop fashion to remove the lens nucleus and epinucleus.  The remaining cortex was then removed using the irrigation and aspiration handpiece. Viscoelastic was then placed into the  capsular bag to distend it for lens placement.  A lens was then injected into the capsular bag.  The remaining viscoelastic was aspirated.   Wounds were hydrated with balanced salt  solution.  The anterior chamber was inflated to a physiologic pressure with balanced salt  solution.   Intracameral vigamox  0.1 mL undiluted was injected into the eye and a drop placed onto the ocular surface.  No wound leaks were noted.  The patient was taken to the recovery room in stable condition without complications of anesthesia or surgery  Adine Novak 03/14/2024, 11:01 AM

## 2024-03-14 NOTE — Transfer of Care (Signed)
 Immediate Anesthesia Transfer of Care Note  Patient: Jose Porter  Procedure(s) Performed: PHACOEMULSIFICATION, CATARACT, WITH IOL INSERTION 20.75 01:38.0 (Right: Eye)  Patient Location: PACU  Anesthesia Type:MAC  Level of Consciousness: awake and alert   Airway & Oxygen Therapy: Patient Spontanous Breathing  Post-op Assessment: Report given to RN and Post -op Vital signs reviewed and stable  Post vital signs: Reviewed and stable Nl temp  Last Vitals:  Vitals Value Taken Time  BP 140/76 03/14/24 11:03  Temp    Pulse 64 03/14/24 11:04  Resp 14 03/14/24 11:04  SpO2 96 % 03/14/24 11:04  Vitals shown include unfiled device data.  Last Pain:  Vitals:   03/14/24 0939  TempSrc: Temporal  PainSc: 0-No pain         Complications: No notable events documented.

## 2024-03-15 ENCOUNTER — Encounter: Payer: Self-pay | Admitting: Ophthalmology
